# Patient Record
Sex: Female | Born: 1980 | Hispanic: Yes | Marital: Single | Smoking: Never smoker
Health system: Southern US, Community
[De-identification: ages and names within clinical notes are randomized; demographics above are authoritative.]

## PROBLEM LIST (undated history)

## (undated) ENCOUNTER — Inpatient Hospital Stay (HOSPITAL_COMMUNITY): Payer: Self-pay

## (undated) ENCOUNTER — Emergency Department (HOSPITAL_COMMUNITY): Payer: Self-pay

## (undated) ENCOUNTER — Emergency Department (HOSPITAL_COMMUNITY): Admission: EM | Payer: Self-pay

## (undated) DIAGNOSIS — F32A Depression, unspecified: Secondary | ICD-10-CM

## (undated) DIAGNOSIS — E119 Type 2 diabetes mellitus without complications: Secondary | ICD-10-CM

## (undated) DIAGNOSIS — Z789 Other specified health status: Secondary | ICD-10-CM

## (undated) HISTORY — DX: Type 2 diabetes mellitus without complications: E11.9

## (undated) HISTORY — PX: APPENDECTOMY: SHX54

## (undated) HISTORY — DX: Other specified health status: Z78.9

---

## 2012-03-30 ENCOUNTER — Other Ambulatory Visit (HOSPITAL_COMMUNITY): Payer: Self-pay | Admitting: Physician Assistant

## 2012-03-30 DIAGNOSIS — Z0489 Encounter for examination and observation for other specified reasons: Secondary | ICD-10-CM

## 2012-03-30 LAB — OB RESULTS CONSOLE HEPATITIS B SURFACE ANTIGEN: Hepatitis B Surface Ag: NEGATIVE

## 2012-03-30 LAB — OB RESULTS CONSOLE HIV ANTIBODY (ROUTINE TESTING): HIV: NONREACTIVE

## 2012-03-30 LAB — OB RESULTS CONSOLE GC/CHLAMYDIA
Chlamydia: NEGATIVE
Gonorrhea: NEGATIVE

## 2012-03-30 LAB — GLUCOSE TOLERANCE, 1 HOUR (50G) W/O FASTING: Glucose, GTT - 1 Hour: 108 mg/dL (ref ?–200)

## 2012-03-30 LAB — OB RESULTS CONSOLE RPR: RPR: NONREACTIVE

## 2012-03-30 LAB — OB RESULTS CONSOLE PLATELET COUNT: Platelets: 239 10*3/uL

## 2012-03-30 LAB — OB RESULTS CONSOLE ABO/RH: RH Type: POSITIVE

## 2012-03-31 ENCOUNTER — Ambulatory Visit (HOSPITAL_COMMUNITY)
Admission: RE | Admit: 2012-03-31 | Discharge: 2012-03-31 | Disposition: A | Discharge status: Home / Self Care | Payer: Self-pay | Source: Ambulatory Visit | Attending: Physician Assistant | Admitting: Physician Assistant

## 2012-03-31 DIAGNOSIS — Z1389 Encounter for screening for other disorder: Secondary | ICD-10-CM | POA: Insufficient documentation

## 2012-03-31 DIAGNOSIS — O09299 Supervision of pregnancy with other poor reproductive or obstetric history, unspecified trimester: Secondary | ICD-10-CM | POA: Insufficient documentation

## 2012-03-31 DIAGNOSIS — O358XX0 Maternal care for other (suspected) fetal abnormality and damage, not applicable or unspecified: Secondary | ICD-10-CM | POA: Insufficient documentation

## 2012-03-31 DIAGNOSIS — Z363 Encounter for antenatal screening for malformations: Secondary | ICD-10-CM | POA: Insufficient documentation

## 2012-03-31 DIAGNOSIS — O0930 Supervision of pregnancy with insufficient antenatal care, unspecified trimester: Secondary | ICD-10-CM | POA: Insufficient documentation

## 2012-03-31 DIAGNOSIS — Z0489 Encounter for examination and observation for other specified reasons: Secondary | ICD-10-CM

## 2012-04-08 ENCOUNTER — Ambulatory Visit (INDEPENDENT_AMBULATORY_CARE_PROVIDER_SITE_OTHER): Payer: Self-pay | Admitting: Advanced Practice Midwife

## 2012-04-08 ENCOUNTER — Encounter: Payer: Self-pay | Admitting: Advanced Practice Midwife

## 2012-04-08 VITALS — BP 113/72 | Temp 97.1°F | Wt 142.0 lb

## 2012-04-08 DIAGNOSIS — O0990 Supervision of high risk pregnancy, unspecified, unspecified trimester: Secondary | ICD-10-CM | POA: Insufficient documentation

## 2012-04-08 DIAGNOSIS — O09219 Supervision of pregnancy with history of pre-term labor, unspecified trimester: Secondary | ICD-10-CM

## 2012-04-08 LAB — POCT URINALYSIS DIP (DEVICE)
Bilirubin Urine: NEGATIVE
Glucose, UA: NEGATIVE mg/dL
Ketones, ur: NEGATIVE mg/dL
Leukocytes, UA: NEGATIVE
Nitrite: NEGATIVE

## 2012-04-08 NOTE — Progress Notes (Signed)
Pulse: 71

## 2012-04-08 NOTE — Progress Notes (Signed)
   Subjective:    Kathryn Glenn is a [id number] [redacted]w[redacted]d being seen today for her first obstetrical visit.  She is transferring care from [facility].  Her obstetrical history is significant for Two NSVDs followed by hx of preterm birth, C/S at 35 weeks in [place] for "baby was too small to be born normally". Pt cannot not read or write.  Patient does intend to breast feed. Pregnancy history fully reviewed.Hospital Spanish interpreter used for all communication.   Patient reports no complaints.  Filed Vitals:   04/08/12 1006  BP: 113/72  Temp: 97.1 F (36.2 C)  Weight: 142 lb (64.411 kg)    HISTORY: OB History    Grav Para Term Preterm Abortions TAB SAB Ect Mult Living   5 3 2 1 1 1    3      # Outc Date GA Lbr Len/2nd Wgt Sex Del Anes PTL Lv   1 TRM 2001 [redacted]w[redacted]d  8lb8oz(3.856kg) F SVD EPI No Yes   2 TRM 2002 [redacted]w[redacted]d  7lb(3.175kg) M SVD EPI No Yes   3 PRE 2003 [redacted]w[redacted]d   M LTCS EPI Yes Yes   4 TAB 2011           5 CUR              Past Medical History  Diagnosis Date  . No pertinent past medical history    Past Surgical History  Procedure Date  . Cesarean section   . Appendectomy    Family History  Problem Relation Age of Onset  . Heart disease Mother      Exam  BP 113/72  Temp 97.1 F (36.2 C)  Wt 142 lb (64.411 kg)  LMP 09/26/2011  Pt alert and oriented, without distress, during today's visit Physical Examination: General appearance - alert, well appearing, and in no distress Neck - supple, no significant adenopathy Chest - clear to auscultation, no wheezes, rales or rhonchi, symmetric air entry Heart - normal rate, regular rhythm, normal S1, S2, no murmurs, rubs, clicks or gallops Abdomen - soft, nontender, gravid, no masses or organomegaly Pelvic - deferred--completed at [facility] Musculoskeletal - no joint tenderness, deformity or swelling Extremities - peripheral pulses normal, no pedal edema, no clubbing or cyanosis   Assessment:    Pregnancy: [id number] 1. History  of preterm delivery, currently pregnant   2. Supervision of high-risk pregnancy         Plan:     Initial labs done at [facility] in [place] tab Prenatal vitamins. Problem list reviewed and updated. Genetic Screening discussed : declined.  Ultrasound discussed; fetal survey: results reviewed.  Follow up in 2 weeks. 50% of 30 min visit spent on counseling and coordination of care.     LEFTWICH-KIRBY, LISA 04/08/2012

## 2012-04-22 ENCOUNTER — Encounter: Payer: Self-pay | Admitting: Family Medicine

## 2012-04-22 ENCOUNTER — Ambulatory Visit (INDEPENDENT_AMBULATORY_CARE_PROVIDER_SITE_OTHER): Payer: Self-pay | Admitting: Family Medicine

## 2012-04-22 VITALS — BP 102/67 | Temp 98.0°F | Wt 142.9 lb

## 2012-04-22 DIAGNOSIS — O09219 Supervision of pregnancy with history of pre-term labor, unspecified trimester: Secondary | ICD-10-CM

## 2012-04-22 DIAGNOSIS — O34219 Maternal care for unspecified type scar from previous cesarean delivery: Secondary | ICD-10-CM

## 2012-04-22 DIAGNOSIS — Z98891 History of uterine scar from previous surgery: Secondary | ICD-10-CM | POA: Insufficient documentation

## 2012-04-22 DIAGNOSIS — Z23 Encounter for immunization: Secondary | ICD-10-CM

## 2012-04-22 DIAGNOSIS — O09899 Supervision of other high risk pregnancies, unspecified trimester: Secondary | ICD-10-CM

## 2012-04-22 LAB — POCT URINALYSIS DIP (DEVICE)
Hgb urine dipstick: NEGATIVE
Ketones, ur: NEGATIVE mg/dL
Protein, ur: NEGATIVE mg/dL
Specific Gravity, Urine: 1.02 (ref 1.005–1.030)
Urobilinogen, UA: 0.2 mg/dL (ref 0.0–1.0)

## 2012-04-22 MED ORDER — INFLUENZA VIRUS VACC SPLIT PF IM SUSP
0.5000 mL | Freq: Once | INTRAMUSCULAR | Status: AC
  Administered 2012-04-22: 0.5 mL via INTRAMUSCULAR

## 2012-04-22 NOTE — Patient Instructions (Signed)
Lactancia materna  (Breastfeeding) Decidir amamantar es una de las mejores elecciones que puede hacer por usted y su beb. La informacin que se brinda a continuacin le dar una breve visin de los beneficios de la lactancia materna as como de las dudas ms frecuentes alrededor de ella.  LOS BENEFICIOS DE AMAMANTAR  Para el beb   La primera leche (calostro ) ayuda al mejor funcionamiento del sistema digestivo del beb.   La leche tiene anticuerpos que provienen de la madre y que ayudan a prevenir las infecciones en el beb.   El beb tiene una menor incidencia de asma, alergias y del sndrome de muerte sbita del lactante (SMSL).   Los nutrientes en la leche materna son mejores para el beb que los preparados para lactantes y la leche materna ayuda a un mejor desarrollo del cerebro del beb.   Los bebs amamantados tienen menos gases, clicos y estreimiento.  Para la mam   La lactancia materna favorece el desarrollo de un vnculo muy especial entre la madre y el beb.   Es ms conveniente, siempre disponible y a la temperatura adecuada y econmico.   Consume caloras en la madre y la ayuda a perder el peso ganado durante el embarazo.   Favorece la contraccin del tero a su tamao normal, de manera ms rpida y disminuye las hemorragias luego del parto.   Las madres que amamantan tienen menor riesgo de desarrollar cncer de mama.  FRECUENCIA DEL AMAMANTAMIENTO   Un beb sano, nacido a trmino, puede amamantarse con tanta frecuencia como cada hora, o espaciar las comidas cada tres horas.   Observe al beb cuando manifieste signos de hambre. Amamante a su beb si muestra signos de hambre. Esta frecuencia variar de un beb a otro.   Amamntelo tan seguido como el beb lo solicite, o cuando usted sienta la necesidad de aliviar sus mamas.   Despierte al beb si han pasado 3  4 horas desde la ltima comida.   El amamantamiento frecuente la ayudar a producir ms  leche y a prevenir problemas de dolor en los pezones e hinchazn de las mamas.  POSICIN DEL BEBE PARA EL AMAMANTAMIENTO   Ya sea que se encuentre acostada o sentada, asegrese que el abdomen del beb enfrente el suyo.   Sostenga la mama con el pulgar por arriba y los otros 4 dedos por debajo. Asegrese que sus dedos se encuentren lejos del pezn y de la boca del beb.   Empuje suavemente los labios del beb con el pezn o con el dedo.   Cuando la boca del beb se abra lo suficiente, introduzca el pezn y la areola tanto como le sea posible dentro de la boca.   Coloque al beb cerca suyo de modo que su nariz y mejillas toquen las mamas al mamar.  ALIMENTACIN Y SUCCIN   La duracin de cada comida vara de un beb a otro y de una comida a otra.   El beb debe succionar entre 2 y 3 minutos para que le llegue leche. Esto se denomina "bajada". Por este motivo, permita que el nio se alimente en cada mama todo lo que desee. Terminar de mamar cuando haya recibido la cantidad adecuada de nutrientes.   Para detener la succin coloque su dedo en la comisura de la boca del nio y deslcelo entre sus encas antes de quitarle la mama de la boca. Esto la ayudar a evitar el dolor en los pezones.  COMO SABER SI EL BEB OBTIENE LA   SUFICIENTE LECHE MATERNA  Preguntarse si el beb obtiene la cantidad suficiente de leche es una preocupacin frecuente entre las madres. Puede asegurarse que el beb tiene la leche suficiente si:   El beb succiona activamente y usted escucha que traga .   El beb parece estar relajado y satisfecho despus de mamar.   El nio se alimenta al menos 8 a 12 veces en 24 horas. Alimntelo hasta que se desprenda por sus propios medios o se quede dormido en la primera mama (al menos durante 10 a 20 minutos), luego ofrzcale el otro lado.   El beb moja 5 a 6 paales desechables (6 a 8 paales de tela) en 24 horas cuando tiene 5  6 das de vida.   Tiene al menos 3 a 4  deposiciones todos los das en los primeros meses. La materia fecal debe ser blanda y amarillenta.   El beb debe aumentar 4 a 6 libras (120 a 170 gr.) por semana despus de los 4 das de vida.   Siente que las mamas se ablandan despus de amamantar  REDUCIR LA CONGESTIN DE LAS MAMAS   Durante la primera semana despus del parto, usted puede experimentar hinchazn en las mamas. Cuando las mamas estn congestionadas, se sienten calientes, llenas y molestas al tacto. Puede reducir la congestin si:   Lo amamanta frecuentemente, cada 2-3 horas. Las mams que amamantan pronto y con frecuencia tienen menos problemas de congestin.   Coloque compresas de hielo en sus mamas durante 10-20 minutos entre cada amamantamiento. Esto ayuda a reducir la hinchazn. Envuelva las bolsas de hielo en una toalla liviana para proteger su piel. Las bolsas de vegetales congelados funcionan bien para este propsito.   Tome una ducha tibia o aplique compresas hmedas calientes en las mamas durante 5 a 10 minutos antes de cada vez que amamanta. Esto aumenta la circulacin y ayuda a que la leche fluya.   Masajee suavemente la mama antes y durante la alimentacin. Con las puntas de los dedos, masajee desde la pared torcica hacia abajo hasta llegar al pezn, con movimientos circulares.   Asegrese que el nio vaca al menos una mama antes de cambiar de lado.   Use un sacaleche para vaciar la mama si el beb se duerme o no se alimenta bien. Tambin podr quitarse la leche con esa bomba si tiene que volver al trabajo o siente que las mamas estn congestionadas.   Evite los biberones, chupetes o complementar la alimentacin con agua o jugos en lugar de la leche materna. La leche materna es todo el alimento que el beb necesita. No es necesario que el nio ingiera agua o preparados de bibern. De hecho, es lo mejor para ayudar a que las mamas produzcan ms leche. no darle suplementos al nio durante las primeras  semanas.   Verifique que el beb se encuentra en la posicin correcta mientras lo alimenta.   Use un sostn que soporte bien sus mamas y evite los que tienen aro.   Consuma una dieta balanceada y beba lquidos en cantidad.   Descanse con frecuencia, reljese y tome sus vitaminas prenatales para evitar la fatiga, el estrs y la anemia.  Si sigue estas indicaciones, la congestin debe mejorar en 24 a 48 horas. Si an tiene dificultades, consulte a su asesor en lactancia.  CUDESE USTED MISMA  Cuide sus mamas.   Bese o dchese diariamente.   Evite usar jabn en los pezones.   Comience a amamantar del lado izquierdo en una comida   y del lado derecho en la siguiente.   Notar que aumenta el flujo de leche a los 2 a 5 das despus del parto. Puede sentir algunas molestias por la congestin, lo que hace que sus mamas estn duras y sensibles. La congestin disminuye en 24 a 48 horas. Mientras tanto, aplique toallas hmedas calientes durante 5 a 10 minutos antes de amamantar. Un masaje suave y la extraccin de un poco de leche antes de amamantar ablandarn las mamas y har ms fcil que el beb se agarre.   Use un buen sostn y seque al aire los pezones durante 3 a 4 minutos luego de cada alimentacin.   Solo utilice apsitos de algodn.   Utilice lanolina pura sobre los pezones luego de amamantar. No necesita lavarlos luego de alimentar al nio. Otra opcin es exprimir algunas gotas de leche y masajear suavemente los pezones.  Cumpla con estos cuidados   Consuma alimentos bien balanceados y refrigerios nutritivos.   Beba leche, jugos de fruta y agua para satisfacer la sed (alrededor de 8 vasos por da).   Descanse lo suficiente.  Evite los alimentos que usted note que pueden afectar al beb.  SOLICITE ATENCIN MDICA SI:   Tiene dificultad con la lactancia materna y necesita ayuda.   Tiene una zona de color rojo, dura y dolorosa en la mama que se acompaa de fiebre.    El beb est muy somnoliento como para alimentarse bien o tiene problemas para dormir.   Su beb moja menos de 6 paales al da, a los 5 das de vida.   La piel del beb o la parte blanca de sus ojos est ms amarilla de lo que estaba en el hospital.   Se siente deprimida.  Document Released: 06/09/2005 Document Revised: 12/09/2011 [employer] Patient Information 2013 [employer].  

## 2012-04-22 NOTE — Progress Notes (Signed)
Was told to wait 10 years after last pregnancy to get pregnant again. No records available.  Nml anatomy but dating is wrong-do not think she needs a f/u scan as all markers were on target.

## 2012-04-22 NOTE — Progress Notes (Signed)
Pulse- 87 

## 2012-05-06 ENCOUNTER — Encounter: Payer: Self-pay | Admitting: Obstetrics and Gynecology

## 2012-05-06 ENCOUNTER — Ambulatory Visit (INDEPENDENT_AMBULATORY_CARE_PROVIDER_SITE_OTHER): Payer: Self-pay | Admitting: Obstetrics and Gynecology

## 2012-05-06 VITALS — BP 105/67 | Temp 97.0°F | Wt 144.4 lb

## 2012-05-06 DIAGNOSIS — O0990 Supervision of high risk pregnancy, unspecified, unspecified trimester: Secondary | ICD-10-CM

## 2012-05-06 DIAGNOSIS — O09219 Supervision of pregnancy with history of pre-term labor, unspecified trimester: Secondary | ICD-10-CM

## 2012-05-06 DIAGNOSIS — O34219 Maternal care for unspecified type scar from previous cesarean delivery: Secondary | ICD-10-CM

## 2012-05-06 LAB — POCT URINALYSIS DIP (DEVICE)
Leukocytes, UA: NEGATIVE
Nitrite: NEGATIVE
Protein, ur: NEGATIVE mg/dL
Urobilinogen, UA: 0.2 mg/dL (ref 0.0–1.0)
pH: 7 (ref 5.0–8.0)

## 2012-05-06 NOTE — Progress Notes (Signed)
Pulse 88 Patient has no complaints today, states everything is going well

## 2012-05-06 NOTE — Progress Notes (Signed)
Patient doing well without complaints. PTL precautions reviewed. Patient planning on breast and formula feed. Patient will be using condoms for birth control.

## 2012-05-27 ENCOUNTER — Ambulatory Visit (INDEPENDENT_AMBULATORY_CARE_PROVIDER_SITE_OTHER): Payer: Self-pay | Admitting: Obstetrics & Gynecology

## 2012-05-27 VITALS — BP 105/68 | Temp 97.3°F | Wt 146.4 lb

## 2012-05-27 DIAGNOSIS — O09219 Supervision of pregnancy with history of pre-term labor, unspecified trimester: Secondary | ICD-10-CM

## 2012-05-27 DIAGNOSIS — O09899 Supervision of other high risk pregnancies, unspecified trimester: Secondary | ICD-10-CM

## 2012-05-27 DIAGNOSIS — O0990 Supervision of high risk pregnancy, unspecified, unspecified trimester: Secondary | ICD-10-CM

## 2012-05-27 LAB — POCT URINALYSIS DIP (DEVICE)
Protein, ur: NEGATIVE mg/dL
Urobilinogen, UA: 0.2 mg/dL (ref 0.0–1.0)

## 2012-05-27 NOTE — Progress Notes (Signed)
P = 85 Mild occasional pain in abdomen and back

## 2012-05-27 NOTE — Progress Notes (Signed)
No complaints

## 2012-05-27 NOTE — Patient Instructions (Signed)
Parto prematuro  (Preterm Labor) El parto prematuro comienza antes de la semana 37 de embarazo. La duracin de un embarazo normal es de 39 a 41 semanas.  CAUSAS  Generalmente no hay una causa que pueda identificarse. Sin embargo, una de las causas conocidas ms frecuentes son las infecciones. Las infecciones del tero, el cuello, la vagina, el lquido amnitico, la vejiga, los riones y hasta de los pulmones (neumona) pueden hacer que el trabajo de parto se inicie. Otras causas son:   Infecciones urogenitales, como infecciones por hongos y vaginosis bacteriana.  Anormalidades uterinas (forma del tero, sptum uterino, fibromas, hemorragias en la placenta).  Un cuello que ha sido operado y se abre prematuramente.  Malformaciones del beb.  Gestaciones mltiples (mellizos, trillizos y ms).  Ruptura del saco amnitico. :Otros factores de riesgo del parto prematuro son   Historia previa de parto prematuro.  Ruptura prematura de las membranas.  La placenta cubre la apertura del cuello (placenta previa).  La placenta se separa del tero (abrupcin placentaria).  El cuello es demasiado dbil para contener al beb en el tero (cuello incompetente).  Hay mucho lquido en el saco amnitico (polihidramnios).  Consumo de drogas o hbito de fumar durante el embarazo.  No aumentar de peso lo suficiente durante el embarazo.  Mujeres menores de 18 aos o mayores de 35 aos aos.  Nivel socioeconmico bajo.  Raza afroamericana. SNTOMAS  Los signos y sntomas son:   Clicos del tipo menstrual  Contracciones con un intervalo entre 30 y 70 segundos, comienzan a ser regulares, se hacen ms frecuentes y se hacen ms intensas y dolorosas.  Contracciones que comienzan en la parte superior del tero y se expanden hacia abajo, hacia la zona inferior del abdomen y la espalda.  Sensacin de presin en la pelvis o dolor en la espalda.  Aparece una secrecin acuosa o sanguinolenta por la  vagina. DIAGNSTICO  El diagnstico puede confirmarse:   Con un examen vaginal.  Ecografa del cuello.  Muestra (hisopado) de las secreciones crvico-vaginales. Estas muestras se analizan para buscar la presencia de fibronectina fetal. Esta protena que se encuentra en las secreciones del tero y se asocia con el parto prematuro.  Monitoreo fetal TRATAMIENTO  Segn el tiempo del embarazo y otras circunstancias, el mdico puede indicar reposo en cama. Si es necesario, le indicarn medicamentos para detener las contracciones y apurar la maduracin de los pulmones del feto. Si el trabajo de parto se inicia antes de las 34 semanas de embarazo, se recomienda la hospitalizacin. El tratamiento depende de las condiciones en que se encuentre la madre y el beb.  PREVENCIN  Hay algunas cosas que una madre puede hacer para disminuir el riesgo de trabajo de parto prematuro en futuros embarazos. Una mam puede:   Dejar de fumar.  Mantener un peso saludable y evitar sustancias qumicas y drogas innecesarias.  Controlar todo tipo de infeccin.  Informar al mdico si tiene una historia conocida de parto prematuro. Document Released: 09/16/2007 Document Revised: 09/01/2011 [employer] Patient Information 2013 [employer].  

## 2012-06-10 ENCOUNTER — Ambulatory Visit (INDEPENDENT_AMBULATORY_CARE_PROVIDER_SITE_OTHER): Payer: Self-pay | Admitting: Obstetrics & Gynecology

## 2012-06-10 ENCOUNTER — Encounter: Payer: Self-pay | Admitting: Obstetrics & Gynecology

## 2012-06-10 VITALS — BP 106/69 | Temp 97.3°F | Wt 146.7 lb

## 2012-06-10 DIAGNOSIS — O34219 Maternal care for unspecified type scar from previous cesarean delivery: Secondary | ICD-10-CM

## 2012-06-10 DIAGNOSIS — O09219 Supervision of pregnancy with history of pre-term labor, unspecified trimester: Secondary | ICD-10-CM

## 2012-06-10 LAB — CBC
MCH: 30.9 pg (ref 26.0–34.0)
MCHC: 34.1 g/dL (ref 30.0–36.0)
RDW: 14.2 % (ref 11.5–15.5)

## 2012-06-10 LAB — POCT URINALYSIS DIP (DEVICE)
Glucose, UA: NEGATIVE mg/dL
Hgb urine dipstick: NEGATIVE
Nitrite: NEGATIVE
Protein, ur: NEGATIVE mg/dL
Urobilinogen, UA: 0.2 mg/dL (ref 0.0–1.0)

## 2012-06-10 NOTE — Progress Notes (Signed)
Pulse 83 28week labs today. 1 hr gtt due at 1120, also needs cbc and rpr

## 2012-06-10 NOTE — Patient Instructions (Signed)
Embarazo  Tercer trimestre  (Pregnancy - Third Trimester) El tercer trimestre del embarazo (los ltimos 3 meses) es el perodo en el cual tanto usted como su beb crecen con ms rapidez. El beb alcanza un largo de aproximadamente 50 cm. y pesa entre 2,700 y 4,500 kg. El beb gana ms tejido graso y est listo para la vida fuera del cuerpo de la madre. Mientras estn en el interior, los bebs tienen perodos de sueo y vigilia, succionan el pulgar y tienen hipo. Quizs sienta pequeas contracciones del tero. Este es el falso trabajo de parto. Tambin se las conoce como contracciones de Braxton-Hicks . Es como una prctica del parto. Los problemas ms habituales de esta etapa del embarazo incluyen mayor dificultad para respirar, hinchazn de las manos y los pies por retencin de lquidos y la necesidad de orinar con ms frecuencia debido a que el tero y el beb presionan sobre la vejiga.  EXAMENES PRENATALES   Durante los exmenes prenatales, deber seguir realizndose anlisis de sangre. Estas pruebas se realizan para controlar su salud y la del beb. Los anlisis de sangre se realizan para conocer los niveles de algunos compuestos de la sangre (hemoglobina). La anemia (bajo nivel de hemoglobina) es frecuente durante el embarazo. Para prevenirla, se administran hierro y vitaminas. Tambin le tomarn nuevas anlisis para descartar diabetes. Podrn repetirle algunas de las pruebas que le hicieron previamente.  En cada visita le medirn el tamao del tero. Esto permite asegurar que el beb se desarrolla adecuadamente, segn la fecha del embarazo.  Le controlarn la presin arterial en cada visita prenatal. Esto es para asegurarse de que no sufre toxemia.  Le harn un anlisis de orina en cada visita prenatal, para descartar infecciones, diabetes y la presencia de protenas.  Tambin en cada visita controlarn su peso. Esto se realiza para asegurarse que aumenta de peso al ritmo indicado y que usted y su  beb evolucionan normalmente.  En algunas ocasiones se realiza una prueba de ultrasonido para confirmar el correcto desarrollo y evolucin del beb. Esta prueba se realiza con ondas sonoras inofensivas para el beb, de modo que el profesional pueda calcular ms precisamente la fecha del parto.  Analice con su mdico los analgsicos y la anestesia que recibir durante el trabajo de parto y el parto.  Comente la posibilidad de que necesite una cesrea y qu anestesia se recibir.  Informe a su mdico si sufre violencia familiar mental o fsica. A veces, se indica la prueba especializada sin estrs, la prueba de tolerancia a las contracciones y el perfil biofsico para asegurarse de que el beb no tiene problemas. El estudio del lquido amnitico que rodea al beb se llama amniocentesis. El lquido amnitico se obtiene introduciendo una aguja en el vientre (abdomen ). En ocasiones se lleva a cabo cerca del final del embarazo, si es necesario inducir a un parto. En este caso se realiza para asegurarse que los pulmones del beb estn lo suficientemente maduros como para que pueda vivir fuera del tero. Si los pulmones no han madurado y es peligroso que el beb nazca, se administrar a la madre una inyeccin de cortisona , 1 a 2 das antes del parto. . Esto ayuda a que los pulmones del beb maduren y sea ms seguro su nacimiento.  CAMBIOS QUE OCURREN EN EL TERCER TRIMESTRE DEL EMBARAZO  Su organismo atravesar numerosos cambios durante el embarazo. Estos pueden variar de una persona a otra. Converse con el profesional que la asiste acerca los cambios que   usted note y que la preocupen.   Durante el ltimo trimestre probablemente sienta un aumento del apetito. Es normal tener "antojos" de ciertas comidas. Esto vara de una persona a otra y de un embarazo a otro.  Podrn aparecer las primeras estras en las caderas, abdomen y mamas. Estos son cambios normales del cuerpo durante el embarazo. No existen  medicamentos ni ejercicios que puedan prevenir estos cambios.  La constipacin puede tratarse con un laxante o agregando fibra a su dieta. Beber grandes cantidades de lquidos, tomar fibras en forma de vegetales, frutas y granos integrales es de gran ayuda.  Tambin es beneficioso practicar actividad fsica. Si ha sido una persona activa hasta el embarazo, podr continuar con la mayora de las actividades durante el mismo. Si ha sido menos activa, puede ser beneficioso que comience con un programa de ejercicios, como realizar caminatas. Consulte con el profesional que la asiste antes de comenzar un programa de ejercicios.  Evite el consumo de cigarrillos, el alcohol, los medicamentos no recetados y las "drogas de la calle" durante el embarazo. Estas sustancias qumicas afectan la formacin y el desarrollo del beb. Evite estas sustancias durante todo el embarazo para asegurar el nacimiento de un beb sano.  Podr sentir dolor de espalda, tener vrices en las venas y hemorroides, o si ya los sufra, pueden empeorar.  Durante el tercer trimestre se cansar con ms facilidad, lo cual es normal.  Los movimientos del beb pueden ser ms fuertes y con ms frecuencia.  Puede que note dificultades para respirar normalmente.  El ombligo puede salir hacia afuera.  A veces sale una secrecin amarilla de las mamas, que se llama calostro.  Podr aparecer una secrecin mucosa con sangre. Esto suele ocurrir entre unos pocos das y una semana antes del parto. INSTRUCCIONES PARA EL CUIDADO EN EL HOGAR   Cumpla con las citas de control. Siga las indicaciones del mdico con respecto al uso de medicamentos, los ejercicios y la dieta.  Durante el embarazo debe obtener nutrientes para usted y para su beb. Consuma alimentos balanceados a intervalos regulares. Elija alimentos como carne, pescado, leche y otros productos lcteos descremados, vegetales, frutas, panes integrales y cereales. El mdico le informar  cul es el aumento de peso ideal.  Las relaciones sexuales pueden continuarse hasta casi el final del embarazo, si no se presentan otros problemas como prdida prematura (antes de tiempo) de lquido amnitico, hemorragia vaginal o dolor en el vientre (abdominal).  Realice actividad fsica todos los das, si no tiene restricciones. Consulte con el profesional que la asiste si no sabe con certeza si determinados ejercicios son seguros. El mayor aumento de peso se producir en los ltimos 2 trimestres del embarazo. El ejercicio ayuda a:  Controlar su peso.  Mantenerse en forma para el trabajo de parto y el parto .  Perder peso despus del parto.  Haga reposo con frecuencia, con las piernas elevadas, o segn lo necesite para evitar los calambres y el dolor de cintura.  Use un buen sostn o como los que se usan para hacer deportes para aliviar la sensibilidad de las mamas. Tambin puede serle til si lo usa mientras duerme. Si pierde calostro, podr utilizar apsitos en el sostn.  No utilice la baera con agua caliente, baos turcos y saunas.  Colquese el cinturn de seguridad cuando conduzca. Este la proteger a usted y al beb en caso de accidente.  Evite comer carne cruda y el contacto con los utensilios y desperdicios de los gatos. Estos elementos   contienen grmenes que pueden causar defectos de nacimiento en el beb.  Es fcil perder algo de orina durante el embarazo. Apretar y fortalecer los msculos de la pelvis la ayudar con este problema. Practique detener la miccin cuando est en el bao. Estos son los mismos msculos que necesita fortalecer. Son tambin los mismos msculos que utiliza cuando trata de evitar despedir gases. Puede practicar apretando estos msculos diez veces, y repetir esto tres veces por da aproximadamente. Una vez que conozca qu msculos debe apretar, no realice estos ejercicios durante la miccin. Puede favorecerle una infeccin si la orina vuelve hacia  atrs.  Pida ayuda si tienen necesidades financieras, teraputicas o nutricionales. El profesional podr ayudarla con respecto a estas necesidades, o derivarla a otros especialistas.  Haga una lista de nmeros telefnicos de emergencia y tngalos disponibles.  Planifique como obtener ayuda de familiares o amigos cuando regrese a casa desde el hospital.  Hacer un ensayo sobre la partida al hospital.  Tome clases prenatales con el padre para entender, practicar y hacer preguntas sobre el trabajo de parto y el alumbramiento.  Preparar la habitacin del beb / busque una guardera.  No viaje fuera de la ciudad a menos que sea absolutamente necesario y con el asesoramiento de su mdico.  Use slo zapatos de tacn bajo o sin tacn para tener mejor equilibrio y evitar cadas. USO DE MEDICAMENTOS Y CONSUMO DE DROGAS DURANTE EL EMBARAZO   Tome las vitaminas apropiadas para esta etapa tal como se le indic. Las vitaminas deben contener un miligramo de cido flico. Guarde todas las vitaminas fuera del alcance de los nios. La ingestin de slo un par de vitaminas o tabletas que contengan hierro pueden ocasionar la muerte en un beb o en un nio pequeo.  Evite el uso de todos los medicamentos, incluyendo hierbas, medicamentos de venta libre, sin receta o que no hayan sido sugeridos por su mdico. Slo tome medicamentos de venta libre o medicamentos recetados para el dolor, el malestar o fiebre como lo indique su mdico. No tome aspirina, ibuprofeno (Motrin, Advil, Nuprin) o naproxeno (Aleve) excepto que su mdico se lo indique.  Infrmele al profesional si consume alguna droga.  El alcohol se relaciona con ciertos defectos congnitos. Incluye el sndrome de alcoholismo fetal. Debe evitar absolutamente el consumo de alcohol, en cualquier forma. El fumar produce baja tasa de natalidad y bebs prematuros.  Las drogas ilegales o de la calle son muy perjudiciales para el beb. Estn absolutamente  prohibidas. Un beb que nace de una madre adicta, ser adicto al nacer. Ese beb tendr los mismos sntomas de abstinencia que un adulto. SOLICITE ATENCIN MDICA SI:  Tiene preguntas o preocupaciones relacionadas con el embarazo. Es mejor que llame para formular las preguntas si no puede esperar hasta la prxima visita, que sentirse preocupada por ellas.  DECISIONES ACERCA DE LA CIRCUNCISIN  Usted puede saber o no cul es el sexo de su beb. Si ya sabe que ser un varn, este es el momento de pensar acerca de la circuncisin. La circuncisin es la extirpacin del prepucio. Esta es la piel que cubre el extremo sensible del pene. No hay un motivo mdico que lo justifique. Generalmente la decisin se toma segn lo que sea popular en ese momento, o segn creencias religiosas. Podr conversar estos temas con su mdico o con el pediatra.  SOLICITE ATENCIN MDICA DE INMEDIATO SI:   La temperatura oral le sube a ms de 102 F (38.9 C) o lo que su mdico le   indique.  Tiene una prdida de lquido por la vagina (canal de parto). Si sospecha una ruptura de las membranas, tmese la temperatura y llame al profesional para informarlo sobre esto.  Observa unas pequeas manchas, una hemorragia vaginal o elimina cogulos. Notifique al profesional acerca de la cantidad y de cuntos apsitos est utilizando.  Presenta un olor desagradable en la secrecin vaginal y observa un cambio en el color, de transparente a blanco.  Ha vomitado durante ms de 24 horas.  Siente escalofros o le sube la fiebre.  Le falta el aire.  Siente ardor al orinar.  Baja o sube ms de 2 libras (900 g), o segn lo indicado por el profesional que la asiste.  Observa que sbitamente se le hinchan el rostro, las manos, los pies o las piernas.  Siente dolor en el vientre (abdominal). Las molestias en el ligamento redondo son una causa benigna frecuente de dolor abdominal durante el embarazo. El profesional que la asiste deber  evaluarla.  Presenta dolor de cabeza intenso que no se alivia.  Tiene problemas visuales, visin doble o borrosa.  Si no siente los movimientos del beb durante ms de 1 hora. Si piensa que el beb no se mueve tanto como lo haca habitualmente, coma algo que contenga azcar y recustese sobre el lado izquierdo durante una hora. El beb debe moverse al menos 4  5 veces por hora. Comunquese inmediatamente si el beb se mueve menos que lo indicado.  Se cae, se ve involucrada en un accidente automovilstico o sufre algn tipo de traumatismo.  En su hogar hay violencia mental o fsica. Document Released: 03/19/2005 Document Revised: 12/09/2011 [employer] Patient Information 2013 [employer].  

## 2012-06-10 NOTE — Progress Notes (Signed)
Pt denies contractions, LOF or VB.  She is seeking other housing issues.  Has custody of her other children.  Will meet with the social worker today. F/u 2 weeks or sooner prn

## 2012-06-11 LAB — RPR

## 2012-06-11 LAB — GLUCOSE TOLERANCE, 1 HOUR (50G) W/O FASTING: Glucose, 1 Hour GTT: 121 mg/dL (ref 70–140)

## 2012-06-24 ENCOUNTER — Ambulatory Visit (INDEPENDENT_AMBULATORY_CARE_PROVIDER_SITE_OTHER): Payer: Self-pay | Admitting: Obstetrics & Gynecology

## 2012-06-24 VITALS — BP 86/59 | Temp 96.9°F | Ht <= 58 in | Wt 148.4 lb

## 2012-06-24 DIAGNOSIS — O0990 Supervision of high risk pregnancy, unspecified, unspecified trimester: Secondary | ICD-10-CM

## 2012-06-24 DIAGNOSIS — O09219 Supervision of pregnancy with history of pre-term labor, unspecified trimester: Secondary | ICD-10-CM

## 2012-06-24 DIAGNOSIS — O34219 Maternal care for unspecified type scar from previous cesarean delivery: Secondary | ICD-10-CM

## 2012-06-24 LAB — POCT URINALYSIS DIP (DEVICE)
Bilirubin Urine: NEGATIVE
Glucose, UA: NEGATIVE mg/dL
Leukocytes, UA: NEGATIVE
Nitrite: NEGATIVE
Urobilinogen, UA: 0.2 mg/dL (ref 0.0–1.0)
pH: 6 (ref 5.0–8.0)

## 2012-06-24 NOTE — Patient Instructions (Signed)
Return to clinic for any obstetric concerns or go to [facility]U for evaluation  

## 2012-06-24 NOTE — Progress Notes (Signed)
p=70 

## 2012-06-24 NOTE — Progress Notes (Signed)
Patient is Spanish-speaking only, Spanish interpreter present for this encounter.  Discussed details of previous cesarean section with patient, this is concerning for classical cesarean section. She underwent this in [place] at 28 weeks, weight was 1 lb, came in with pain and a lot of bleeding, baby was transverse and she underwent high risk cesarean section.  She was advised not to get pregnant for 10 years after this high risk cesarean section.  This scenario is very consistent with classical cesarean section.  Recommended delivery at 36-37 weeks because of high risk of uterine rupture; will send in surgical order.  No other complaints or concerns.  Fetal movement and labor precautions reviewed.

## 2012-07-07 ENCOUNTER — Encounter: Payer: Self-pay | Admitting: Advanced Practice Midwife

## 2012-07-08 ENCOUNTER — Encounter (HOSPITAL_COMMUNITY): Payer: Self-pay | Admitting: *Deleted

## 2012-07-08 ENCOUNTER — Inpatient Hospital Stay (HOSPITAL_COMMUNITY)
Admission: AD | Admit: 2012-07-08 | Discharge: 2012-07-08 | Disposition: A | Discharge status: Home / Self Care | Payer: Self-pay | Source: Ambulatory Visit | Attending: Obstetrics and Gynecology | Admitting: Obstetrics and Gynecology

## 2012-07-08 DIAGNOSIS — W010XXA Fall on same level from slipping, tripping and stumbling without subsequent striking against object, initial encounter: Secondary | ICD-10-CM | POA: Insufficient documentation

## 2012-07-08 DIAGNOSIS — O99891 Other specified diseases and conditions complicating pregnancy: Secondary | ICD-10-CM | POA: Insufficient documentation

## 2012-07-08 DIAGNOSIS — S0990XA Unspecified injury of head, initial encounter: Secondary | ICD-10-CM | POA: Insufficient documentation

## 2012-07-08 DIAGNOSIS — W19XXXA Unspecified fall, initial encounter: Secondary | ICD-10-CM

## 2012-07-08 DIAGNOSIS — R109 Unspecified abdominal pain: Secondary | ICD-10-CM | POA: Insufficient documentation

## 2012-07-08 DIAGNOSIS — M549 Dorsalgia, unspecified: Secondary | ICD-10-CM | POA: Insufficient documentation

## 2012-07-08 LAB — URINALYSIS, ROUTINE W REFLEX MICROSCOPIC
Bilirubin Urine: NEGATIVE
Hgb urine dipstick: NEGATIVE
Specific Gravity, Urine: 1.01 (ref 1.005–1.030)
Urobilinogen, UA: 0.2 mg/dL (ref 0.0–1.0)
pH: 6.5 (ref 5.0–8.0)

## 2012-07-08 MED ORDER — ACETAMINOPHEN 500 MG PO TABS: 1000.0000 mg | ORAL_TABLET | Freq: Four times a day (QID) | ORAL | Status: DC | PRN

## 2012-07-08 MED ORDER — OXYCODONE-ACETAMINOPHEN 5-325 MG PO TABS
1.0000 | ORAL_TABLET | Freq: Once | ORAL | Status: AC
  Administered 2012-07-08: 1 via ORAL
  Filled 2012-07-08: qty 1

## 2012-07-08 NOTE — [facility]U Note (Signed)
Pt states she fell yesterday, landed on her back, has been having lower abd pain ever since, worse on the right.  Denies any bleeding or LOF.  Feels active FM.

## 2012-07-08 NOTE — [facility]U Provider Note (Signed)
History     CSN: [id number]  Arrival date and time: 07/08/12 8469   First Provider Initiated Contact with Patient 07/08/12 1004      Chief Complaint  Patient presents with  . Fall  . Abdominal Pain   HPI 32 y.o. [id number] @ [redacted]w[redacted]d presents today for abd and back pain after tripping and falling yesterday hitting her back and head on the carpeted floor.  Pt reports pain in lower rt back and left mid and lower abd is a dull pain.  Fetal movement is present and unchanged.  Contractions have increased in intensity and frequency.  Pt also endorses a headache, bilateral eye pain, and blurred vision.  Denies any LOC or loss of sensation, dizziness, change in balance, vaginal bleeding, vaginal discharge, or cramping. OB History    Grav Para Term Preterm Abortions TAB SAB Ect Mult Living   5 3 2 1 1 1    3       Past Medical History  Diagnosis Date  . No pertinent past medical history   . Preterm labor     Past Surgical History  Procedure Date  . Appendectomy   . Cesarean section     C/S x 1    Family History  Problem Relation Age of Onset  . Heart disease Mother     History  Substance Use Topics  . Smoking status: Never Smoker   . Smokeless tobacco: Never Used  . Alcohol Use: No    Allergies: No Known Allergies  Prescriptions prior to admission  Medication Sig Dispense Refill  . Prenatal Vit-Fe Fumarate-FA (PRENATAL VITAMINS) 28-0.8 MG TABS Take 1 tablet by mouth.        Review of Systems  Constitutional: Negative for fever and chills.  HENT: Negative for hearing loss, ear pain, nosebleeds, tinnitus and ear discharge.   Eyes: Positive for blurred vision and pain (bilateral). Negative for double vision and photophobia.  Respiratory: Negative.   Cardiovascular: Negative.   Gastrointestinal: Positive for abdominal pain.  Genitourinary: Negative for dysuria.  Musculoskeletal: Positive for back pain and falls.  Skin: Negative for rash.  Neurological: Positive for  headaches. Negative for dizziness, tingling, sensory change, speech change, focal weakness, loss of consciousness and weakness.  Psychiatric/Behavioral: Negative for memory loss.   Physical Exam   Blood pressure 98/65, pulse 98, temperature 97.7 F (36.5 C), temperature source Oral, resp. rate 18, last menstrual period 09/26/2011.  Physical Exam  Constitutional: She is oriented to person, place, and time. She appears well-developed and well-nourished.  HENT:  Head: Normocephalic and atraumatic.  Nose: Nose normal.  Mouth/Throat: Oropharynx is clear and moist.  Eyes: Conjunctivae normal and EOM are normal. Pupils are equal, round, and reactive to light.  Neck: Normal range of motion. Neck supple.  Cardiovascular: Normal rate, regular rhythm, normal heart sounds and intact distal pulses.   Respiratory: Effort normal and breath sounds normal.  GI: Soft. Bowel sounds are normal. There is tenderness.  Genitourinary: There is no rash, tenderness, lesion or injury on the right labia. There is no rash, tenderness, lesion or injury on the left labia. No bleeding around the vagina. No vaginal discharge found.  Musculoskeletal: Normal range of motion.  Neurological: She is alert and oriented to person, place, and time. She has normal reflexes.  Skin: Skin is warm and dry. No rash noted. No erythema. No pallor.  Psychiatric: She has a normal mood and affect.  Dilation: Closed Effacement (%): Thick Cervical Position: Posterior Station: -3 Exam by::  Ivonne Andrew CNM FHR 140-150, minimal-moderate variability, 15x15 accels, no decels.  Toco: Intermittent periods of frequent UI and mild painless UC's. Improved w/ PO fluids   MAU Course  Procedures Dr. Patria Mane at [facility] consulted via phone at [phone] regarding pt's head injury.  No CT recommended at this time.  Precautionary instructions received and given to pt. Spontaneous resolution of pain when CNM returned to room.    Assessment and Plan   1. Fall     2. Abdominal pain in pregnancy   3. Head injury, closed    P: Follow up in clinic as scheduled - preterm labor precautions, fetal kick count -head injury precautions.  Henn[place]gaard, Bradley 07/08/2012, 10:49 AM

## 2012-07-14 NOTE — MAU Provider Note (Signed)
Attestation of Attending Supervision of Advanced Practitioner (CNM/NP): Evaluation and management procedures were performed by the Advanced Practitioner under my supervision and collaboration.  I have reviewed the Advanced Practitioner's note and chart, and I agree with the management and plan.  Terree Gaultney 07/14/2012 9:15 AM

## 2012-07-15 ENCOUNTER — Ambulatory Visit (INDEPENDENT_AMBULATORY_CARE_PROVIDER_SITE_OTHER): Payer: Self-pay | Admitting: Advanced Practice Midwife

## 2012-07-15 VITALS — BP 127/77 | Temp 97.0°F | Wt 149.2 lb

## 2012-07-15 DIAGNOSIS — O09899 Supervision of other high risk pregnancies, unspecified trimester: Secondary | ICD-10-CM

## 2012-07-15 LAB — POCT URINALYSIS DIP (DEVICE)
Bilirubin Urine: NEGATIVE
Glucose, UA: NEGATIVE mg/dL
Hgb urine dipstick: NEGATIVE
Ketones, ur: NEGATIVE mg/dL
Leukocytes, UA: NEGATIVE
Nitrite: NEGATIVE
pH: 7 (ref 5.0–8.0)

## 2012-07-15 NOTE — Progress Notes (Signed)
Pulse: 76

## 2012-07-15 NOTE — Patient Instructions (Signed)
Parto por cesrea  (Cesarean Delivery) El parto por cesrea es el nacimiento de un feto a travs de un corte (incisin) en el vientre (abdomen) y en el matriz (tero).  HGALE SABER A SU MDICO SOBRE:   Complicaciones del embarazo.  Alergias.  Medicamentos que [place], incluyendo hierbas, gotas oftlmicas, medicamentos de [place] y Control and instrumentation engineer.  Uso de corticoides (por va oral o cremas).  Problemas anteriores debido a anestsicos o a medicamentos que [employer] sensibilidad.  Cirugas anteriores.  Historia de cogulos sanguneos  Problemas hemorrgicos o en la sangre.  Otros problemas de [place]. RIESGOS Y COMPLICACIONES  Hemorragias.  Infeccin.  Cogulos sanguneos.  Lesin en los rganos circundantes.  Problemas con la anestesia.  Lesiones al beb. ANTES DEL PROCEDIMIENTO   Le insertarn un tubo (catter [employer] vejiga. El catter Foley drena la orina desde la vejiga hacia [place]. Esto mantendr la vejiga vaca durante la [place].  Le colocarn un catter de acceso intravenoso (IV) en el brazo.  Luego rasurarn la zona del pubis y la parte inferior del abdomen. Esto se realiza para evitar una infeccin en el sitio de la incisin.  Le administrarn un medicamento anticido. Esto impedir que los contenidos cidos del estmago ingresen a los pulmones si vomita durante el procedimiento.  Le podrn dar antibiticos para prevenir infecciones. PROCEDIMIENTO   Le administrarn un medicamento para adormecer a zona inferior del cuerpo anestesia regional). Si estuviera en trabajo de parto, podrn aplicarle una anestesia epidural, que se utiliza tanto el el [place] de parto como en la cesrea. Puede ser [employer] administren un medicamento que la har dormir (anestesia general), aunque esto no es tan frecuente.  Le harn una incisin en el abdomen que se extiende [employer]. Hay dos tipos bsicos de incisin:  La incisin horizontal (transversa) Las incisiones  horizontales se Engineer, maintenance mayor parte de las cesreas de [place].  La incisin vertical (de Jolene Schimke). Esta es la menos frecuente. Se reserva para aquellas mujeres que tienen una complicacin grave (parto prematuro extremo) o bajo situaciones de Associate Professor.  Las incisiones horizontales y Garment/textile technologist pueden utilizarse ambas al [employer]. Sin embargo, no es Air traffic controller.  El beb nacer. DESPUS DEL PROCEDIMIENTO   Si estuvo despierta durante la [place], podr ver al beb enseguida. Si la duermen, ver al beb tan pronto como despierte.  Podr amamantar a su beb despus del procedimiento.  Podr levantarse y Therapist, art mismo da de la [place]. Si Office manager en cama durante cierto tiempo, recibir ayuda para darse vuelta, toser y Industrial/product designer profundamente despus de la ciruga. Esto ayuda a Environmental health practitioner en los pulmones, como la neumona.  No se levante de la cama sola la primera vez luego de la [place]. Necesitar ayuda para levantarse de la cama hasta que pueda hacerlo sola.  Podr darse Shaune Spittle el da siguiente a la [place]. Despus que le quiten el vendaje, un enfermero la ayudar a ducharse.  Tendr unas medias compresivas neumticas en los pies o en la zona inferior de las piernas. Se colocan para prevenir cogulos sanguneos. Cuando se levante y camine regularmente, ya no sern necesarias.  Nocruce las piernas al sentarse.  Si elimina cogulos de sangre, gurdelos. Si elimina un cogulo cuando va al bao, por favor no tire la cadena. Llame al enfermero. Comunquele al enfermero si piensa que tiene demasiada hemorragia o que elimina muchos cogulos.  Comience a beber lquidos y a alimentarse segn las indicaciones del mdico. Si el  estmago no est en condiciones,comer y beber demasiado pronto puede causar aumento de la hinchazn y la inflamacin del intestino o el abdomen. Esto es [employer].  Le darn medicamentos si los necesita. Dgale a los  profesionales si siente dolor. Ellos tratarn de que se sienta cmoda. Tambin le indicarn antibiticos para prevenir una infeccin.  Le quitarn la va intravenosa cuando beba una cantidad razonable de lquido. El catter Foley se retirar cuando se levante y camine.  Si su tipo sanguneo es Rh negativo y el beb es Rh positivo, le darn una inyeccin de inmunoblobulina anti D. Esta inyeccin evita que tenga problemas con el Rh en embarazos futuros. Deber colocarse la inyeccin an si se ha Production assistant, radio las trompas.  Si le permiten llevar al beb a dar un paseo,colquelo en la cunita y empjela. No lleve al beb en sus brazos. Document Released: 06/09/2005 Document Revised: 09/01/2011 [facility] [address] Patient Information 2013 [place], [place].   Mtodo para contar los movimientos fetales (Fetal Movement Counts) Nombre de la paciente: __________________________________________________ Franco Nones probable de parto:____________________ En los embarazos de alto riesgo se recomienda contar las pataditas, pero tambin es una buena idea que lo hagan todas las Olivia. Comience a contarlas a las 28 semanas de embarazo. Los movimientos fetales aumentan luego de una comida Immunologist o de comer o beber algo dulce (el nivel de azcar en la sangre est ms alto). Tambin es importante beber gran cantidad de lquidos (hidratarse bien) antes de contar. Si se recuesta sobre el lado izquierdo mejorar la Designer, industrial/product, o puede sentarse en una silla cmoda con los brazos sobre el abdomen y sin distracciones que la rodeen. CONTANDO  Trate de contar a la [employer] lo haga.  Marque el da y la hora y vea cunto le lleva sentir 10 movimientos (patadas, agitaciones, sacudones, vueltas). Debe sentir al menos 10 movimientos en 2 horas. Probablemente sienta los 10 movimientos en menos de dos horas. Si no los siente, espere una hora y cuente nuevamente. Luego de [employer] tendr un patrn.  Debemos observar si  hay cambios en el patrn o no hay suficientes pataditas en 2 horas. Le lleva ms tiempo contar los 10 movimientos? SOLICITE ATENCIN MDICA SI:  Siente menos de 10 pataditas en 2 horas. Intntelo dos veces.  No siente movimientos durante 1 hora.  El patrn se modifica o le lleva ms tiempo Art gallery manager las 10 pataditas.  Siente que el beb no se mueve como lo hace habitualmente. Fecha: ____________ Movimientos: ____________ Comienzo hora: ____________ Cephas Darby: ____________ Franco Nones: ____________ Movimientos: ____________ Comienzo hora: ____________ Cephas Darby: ____________ Franco Nones: ____________ Movimientos: ____________ Comienzo hora: ____________ Cephas Darby: ____________ Franco Nones: ____________ Movimientos: ____________ Comienzo hora: ____________ Cephas Darby: ____________ Franco Nones: ____________ Movimientos: ____________ Comienzo hora: ____________ Cephas Darby: ____________ Franco Nones: ____________ Movimientos: ____________ Comienzo hora: ____________ Cephas Darby: ____________ Franco Nones: ____________ Movimientos: ____________ Comienzo hora: ____________ Cephas Darby: ____________  Franco Nones: ____________ Movimientos: ____________ Comienzo hora: ____________ Cephas Darby: ____________ Franco Nones: ____________ Movimientos: ____________ Comienzo hora: ____________ Cephas Darby: ____________ Franco Nones: ____________ Movimientos: ____________ Comienzo hora: ____________ Cephas Darby: ____________ Franco Nones: ____________ Movimientos: ____________ Comienzo hora: ____________ Cephas Darby: ____________ Franco Nones: ____________ Movimientos: ____________ Comienzo hora: ____________ Cephas Darby: ____________ Franco Nones: ____________ Movimientos: ____________ Comienzo hora: ____________ Cephas Darby: ____________ Franco Nones: ____________ Movimientos: ____________ Comienzo hora: ____________ Cephas Darby: ____________  Franco Nones: ____________ Movimientos: ____________ Comienzo hora: ____________ Cephas Darby: ____________ Franco Nones: ____________ Movimientos: ____________ Comienzo hora: ____________ Cephas Darby: ____________ Franco Nones: ____________ Movimientos: ____________ Comienzo hora: ____________ Cephas Darby:  ____________ Franco Nones: ____________ Movimientos: ____________ Comienzo hora: ____________ Cephas Darby: ____________ Franco Nones: ____________ Movimientos: ____________ Comienzo hora: ____________ Cephas Darby: ____________ Franco Nones: ____________ Movimientos: ____________ Comienzo hora: ____________ Cephas Darby: ____________ Franco Nones: ____________ Movimientos: ____________ Comienzo hora: ____________ Cephas Darby: ____________  Franco Nones: ____________ Movimientos: ____________ Comienzo hora: ____________ Cephas Darby: ____________ Franco Nones: ____________ Movimientos: ____________ Comienzo hora: ____________ Cephas Darby: ____________ Franco Nones: ____________ Movimientos: ____________ Comienzo hora: ____________ Cephas Darby: ____________ Franco Nones: ____________ Movimientos: ____________ Comienzo hora: ____________ Cephas Darby: ____________ Franco Nones: ____________ Movimientos: ____________ Comienzo hora: ____________ Cephas Darby: ____________ Franco Nones: ____________ Movimientos: ____________ Comienzo hora: ____________ Cephas Darby: ____________ Franco Nones: ____________ Movimientos: ____________ Comienzo hora: ____________ Cephas Darby: ____________  Franco Nones: ____________ Movimientos: ____________ Comienzo hora: ____________ Cephas Darby: ____________ Franco Nones: ____________ Movimientos: ____________ Comienzo hora: ____________ Cephas Darby: ____________ Franco Nones: ____________ Movimientos: ____________ Comienzo hora: ____________ Cephas Darby: ____________ Franco Nones: ____________ Movimientos: ____________ Comienzo hora: ____________ Cephas Darby: ____________ Franco Nones: ____________ Movimientos: ____________ Comienzo hora: ____________ Cephas Darby: ____________ Franco Nones: ____________ Movimientos: ____________ Comienzo hora: ____________ Cephas Darby: ____________ Franco Nones: ____________ Movimientos: ____________ Comienzo hora: ____________ Cephas Darby: ____________  Franco Nones: ____________ Movimientos: ____________ Comienzo hora:  ____________ Cephas Darby: ____________ Franco Nones: ____________ Movimientos: ____________ Comienzo hora: ____________ Cephas Darby: ____________ Franco Nones: ____________ Movimientos: ____________ Comienzo hora: ____________ Cephas Darby: ____________ Franco Nones: ____________ Movimientos: ____________ Comienzo hora: ____________ Cephas Darby: ____________ Franco Nones: ____________ Movimientos: ____________ Comienzo hora: ____________ Cephas Darby: ____________ Franco Nones: ____________ Movimientos: ____________ Comienzo hora: ____________ Cephas Darby: ____________ Franco Nones: ____________ Movimientos: ____________ Comienzo hora: ____________ Cephas Darby: ____________  Franco Nones: ____________ Movimientos: ____________ Comienzo hora: ____________ Cephas Darby: ____________ Franco Nones: ____________ Movimientos: ____________ Comienzo hora: ____________ Cephas Darby: ____________ Franco Nones: ____________ Movimientos: ____________ Comienzo hora: ____________ Cephas Darby: ____________ Franco Nones: ____________ Movimientos: ____________ Comienzo hora: ____________ Cephas Darby: ____________ Franco Nones: ____________ Movimientos: ____________ Comienzo hora: ____________ Cephas Darby: ____________ Franco Nones: ____________ Movimientos: ____________ Comienzo hora: ____________ Cephas Darby: ____________ Franco Nones: ____________ Movimientos: ____________ Comienzo hora: ____________ Cephas Darby: ____________  Franco Nones: ____________ Movimientos: ____________ Comienzo hora: ____________ Cephas Darby: ____________ Franco Nones: ____________ Movimientos: ____________ Comienzo hora: ____________ Cephas Darby: ____________ Franco Nones: ____________ Movimientos: ____________ Comienzo hora: ____________ Cephas Darby: ____________ Franco Nones: ____________ Movimientos: ____________ Comienzo hora: ____________ Cephas Darby: ____________ Franco Nones: ____________ Movimientos: ____________ Comienzo hora: ____________ Cephas Darby: ____________ Franco Nones: ____________ Movimientos: ____________ Comienzo hora: ____________ Cephas Darby: ____________ Franco Nones: ____________ Movimientos: ____________  Comienzo hora: ____________ Fin hora: ____________  Document Released: 09/16/2007 Document Revised: 09/01/2011 [employer] Patient Information 2013 [place], [place].   Contracciones de Designer, multimedia ([employer]xton [employer]) Usted presenta un falso trabajo de [place]. Durante todo el embarazo aparecen con frecuencia contracciones del tero. Hacia el final del embarazo (32-34 semanas) estas contracciones (Braxton Hicks) pueden hacerse ms fuertes. No se trata de un trabajo de parto verdadero porque no producen un agrandamiento (dilatacin) y afinamiento del cuello del tero. Algunas veces resulta difcil distinguirlas del trabajo de parto verdadero porque en algunos casos llegan a ser muy intensas y las personas tienen distinta tolerancia al [employer]. No debe sentirse avergonzada si ingresa al hospital con un falso trabajo de [place]. En ocasiones la nica forma de saber si est en un parto verdadero es observar los cambios en el cuello del tero. A veces, la nica forma de saber si realmente est en trabajo de parto es para el mdico observar los cambios en el tero. Como diferenciar el [place] de parto falso del verdadero:  Aggie Cosier de parto falso.  Las contracciones falsas generalmente duran menos y no son tan intensas como las verdaderas.  Generalmente se sienten en la zona inferior del abdomen y  en la ingle.  Pueden aliviarse con una caminata o cambiar de posicin mientras se est acostada.  A medida que pasa el tiempo son ms cortas y dbiles.  Generalmente son irregulares.  No se hacen progresivamente ms intensas y Herbalist entre s [employer].  Trabajo de parto verdadero.  Las contracciones verdaderas duran de 30 a 70 segundos, son ms regulares, generalmente se hacen ms intensas y Comptroller.  No desaparecen al caminar.  La molestia generalmente se siente en la parte superior del tero y se extiende hacia la zona inferior del abdomen y [employer] cintura.  El  profesional que la asiste podr examinarla para determinar si el trabajo de parto es verdadero. El examen mostrar si el cuello del tero se est dilatando y [employer]. Si no hay problemas prenatales u otras complicaciones de la salud asociadas al embarazo, no habr inconvenientes si la envan a su casa y espera el comienzo del verdadero trabajo de [place]. INSTRUCCIONES PARA EL CUIDADO DOMICILIARIO  Siga con los ejercicios y las indicaciones habituales.  Tome los medicamentos como se le indic.  Cumpla con las citas regularmente.  Coma y beba ligero si cree que dar a luz.  Si se siente incmoda por las contracciones:  Cambie de [place], si est acostada o en reposo, camine y si est caminando, repose.  Sintense y repose en una baadera con agua caliente.  Beba entre 2 y 3 vasos de [place]. La deshidratacin puede causar contracciones BH.  Respire lenta y profundamente varias veces por hora. SOLICITE ATENCIN MDICA DE INMEDIATO SI:  Las contracciones se intensifican, se hacen ms regulares y [employer] s.  Tiene una prdida importante de lquido de la vagina  La temperatura oral se eleva sin motivo por encima de 102 F (38.9 C) o segn le indique el profesional que la asiste.  Elimina una mucosidad sanguinolenta.  Presenta hemorragia vaginal.  Presenta dolor abdominal constante.  Siente un dolor en la parte baja de la espalda que nunca haba sentido antes.  Siente que el beb empuja hacia abajo y le causa presin plvica.  El beb no se mueve tanto como antes. Document Released: 03/19/2005 Document Revised: 09/01/2011 [facility] Patient Information 2013 [place], [place].

## 2012-07-15 NOTE — Progress Notes (Signed)
Seen in [facility]U 07/08/12 for UC's, rare, VE long and closed. 1-3 /hour now. Declines VE today. Risks of uterine rupture reviewed.

## 2012-07-29 ENCOUNTER — Ambulatory Visit (INDEPENDENT_AMBULATORY_CARE_PROVIDER_SITE_OTHER): Payer: Self-pay | Admitting: Family

## 2012-07-29 ENCOUNTER — Encounter: Payer: Self-pay | Admitting: Family

## 2012-07-29 VITALS — BP 128/79 | Temp 97.1°F | Wt 151.0 lb

## 2012-07-29 DIAGNOSIS — O34219 Maternal care for unspecified type scar from previous cesarean delivery: Secondary | ICD-10-CM

## 2012-07-29 DIAGNOSIS — O09219 Supervision of pregnancy with history of pre-term labor, unspecified trimester: Secondary | ICD-10-CM

## 2012-07-29 DIAGNOSIS — Z23 Encounter for immunization: Secondary | ICD-10-CM

## 2012-07-29 DIAGNOSIS — O09899 Supervision of other high risk pregnancies, unspecified trimester: Secondary | ICD-10-CM

## 2012-07-29 LAB — POCT URINALYSIS DIP (DEVICE)
Bilirubin Urine: NEGATIVE
Glucose, UA: NEGATIVE mg/dL
Hgb urine dipstick: NEGATIVE
Leukocytes, UA: NEGATIVE
Nitrite: NEGATIVE
pH: 6.5 (ref 5.0–8.0)

## 2012-07-29 MED ORDER — TETANUS-DIPHTH-ACELL PERTUSSIS 5-2.5-18.5 LF-MCG/0.5 IM SUSP: 0.5000 mL | Freq: Once | INTRAMUSCULAR | Status: DC

## 2012-07-29 NOTE — Progress Notes (Signed)
Hx of presumed classical csection > repeat scheduled on 2/18 0930 Constant at 37 wks

## 2012-07-29 NOTE — Progress Notes (Signed)
Reports three braxton hicks yesterday; no bleeding; discussed importance of reporting vaginal bleeding or consistent pain.

## 2012-07-29 NOTE — Addendum Note (Signed)
Addended by: Soyla Murphy T on: 07/29/2012 12:59 PM   Modules accepted: Orders

## 2012-07-29 NOTE — Progress Notes (Signed)
Pulse-64   Edema-feet  Pain- in front of stomach

## 2012-07-29 NOTE — Addendum Note (Signed)
Addended by: Gerome Apley on: 07/29/2012 09:26 AM   Modules accepted: Orders

## 2012-08-04 ENCOUNTER — Encounter: Payer: Self-pay | Admitting: Family

## 2012-08-05 ENCOUNTER — Encounter: Payer: Self-pay | Admitting: Family

## 2012-08-06 ENCOUNTER — Encounter (HOSPITAL_COMMUNITY): Payer: Self-pay

## 2012-08-09 ENCOUNTER — Encounter (HOSPITAL_COMMUNITY)
Admission: RE | Admit: 2012-08-09 | Discharge: 2012-08-09 | Disposition: A | Discharge status: Home / Self Care | Payer: Medicaid Other | Source: Ambulatory Visit | Attending: Obstetrics and Gynecology | Admitting: Obstetrics and Gynecology

## 2012-08-09 ENCOUNTER — Encounter (HOSPITAL_COMMUNITY): Payer: Self-pay

## 2012-08-09 VITALS — BP 118/70 | Ht <= 58 in | Wt 155.0 lb

## 2012-08-09 DIAGNOSIS — O34219 Maternal care for unspecified type scar from previous cesarean delivery: Secondary | ICD-10-CM

## 2012-08-09 DIAGNOSIS — O0990 Supervision of high risk pregnancy, unspecified, unspecified trimester: Secondary | ICD-10-CM

## 2012-08-09 HISTORY — DX: Other specified health status: Z78.9

## 2012-08-09 LAB — TYPE AND SCREEN: ABO/RH(D): O POS

## 2012-08-09 LAB — CBC
HCT: 39.2 % (ref 36.0–46.0)
Hemoglobin: 13 g/dL (ref 12.0–15.0)
RBC: 4.31 MIL/uL (ref 3.87–5.11)

## 2012-08-09 LAB — ABO/RH: ABO/RH(D): O POS

## 2012-08-09 LAB — RPR: RPR Ser Ql: NONREACTIVE

## 2012-08-09 NOTE — Patient Instructions (Addendum)
Your procedure is scheduled on:08/10/12  Enter through the Main Entrance at :8am  Pick up desk phone and dial [phone] and inform us of your arrival.  Please call [phone] if you have any problems the morning of surgery.  Remember: Do not eat or drink after midnight:tonight   DO NOT wear jewelry, eye make-up, lipstick,body lotion, or dark fingernail polish. Do not shave for 48 hours prior to surgery.  If you are to be admitted after surgery, leave suitcase in car until your room has been assigned. Patients discharged on the day of surgery will not be allowed to drive home.

## 2012-08-09 NOTE — Pre-Procedure Instructions (Signed)
Eda Royal, Spanish interpreter, present to obtain info and to give pt  Spanish inst.

## 2012-08-09 NOTE — Pre-Procedure Instructions (Signed)
Dr. Rodman Pickle made aware of pt's low platelet count of 132,000. She said "ok, no need to repeat"

## 2012-08-10 ENCOUNTER — Encounter (HOSPITAL_COMMUNITY)
Admission: RE | Disposition: A | Discharge status: Home / Self Care | Payer: Self-pay | Source: Ambulatory Visit | Attending: Obstetrics and Gynecology

## 2012-08-10 ENCOUNTER — Inpatient Hospital Stay (HOSPITAL_COMMUNITY)
Admission: RE | Admit: 2012-08-10 | Discharge: 2012-08-13 | DRG: 766 | Disposition: A | Discharge status: Home / Self Care | Payer: Medicaid Other | Source: Ambulatory Visit | Attending: Obstetrics and Gynecology | Admitting: Obstetrics and Gynecology

## 2012-08-10 ENCOUNTER — Encounter (HOSPITAL_COMMUNITY): Payer: Self-pay | Admitting: *Deleted

## 2012-08-10 ENCOUNTER — Encounter (HOSPITAL_COMMUNITY): Payer: Self-pay | Admitting: Anesthesiology

## 2012-08-10 ENCOUNTER — Inpatient Hospital Stay (HOSPITAL_COMMUNITY): Payer: Medicaid Other | Admitting: Anesthesiology

## 2012-08-10 DIAGNOSIS — O34219 Maternal care for unspecified type scar from previous cesarean delivery: Secondary | ICD-10-CM

## 2012-08-10 DIAGNOSIS — Z01818 Encounter for other preprocedural examination: Secondary | ICD-10-CM

## 2012-08-10 DIAGNOSIS — Z98891 History of uterine scar from previous surgery: Secondary | ICD-10-CM

## 2012-08-10 DIAGNOSIS — Z01812 Encounter for preprocedural laboratory examination: Secondary | ICD-10-CM

## 2012-08-10 DIAGNOSIS — O0990 Supervision of high risk pregnancy, unspecified, unspecified trimester: Secondary | ICD-10-CM

## 2012-08-10 SURGERY — Surgical Case
Anesthesia: Spinal | Site: Abdomen | Wound class: Clean Contaminated

## 2012-08-10 MED ORDER — CEFAZOLIN SODIUM-DEXTROSE 2-3 GM-% IV SOLR
INTRAVENOUS | Status: DC | PRN
  Administered 2012-08-10: 2 g via INTRAVENOUS

## 2012-08-10 MED ORDER — DIPHENHYDRAMINE HCL 25 MG PO CAPS
25.0000 mg | ORAL_CAPSULE | ORAL | Status: DC | PRN
  Filled 2012-08-10: qty 1

## 2012-08-10 MED ORDER — SODIUM CHLORIDE 0.9 % IJ SOLN: 3.0000 mL | INTRAMUSCULAR | Status: DC | PRN

## 2012-08-10 MED ORDER — TETANUS-DIPHTH-ACELL PERTUSSIS 5-2.5-18.5 LF-MCG/0.5 IM SUSP: 0.5000 mL | Freq: Once | INTRAMUSCULAR | Status: DC

## 2012-08-10 MED ORDER — DIBUCAINE 1 % RE OINT: 1.0000 "application " | TOPICAL_OINTMENT | RECTAL | Status: DC | PRN

## 2012-08-10 MED ORDER — SCOPOLAMINE 1 MG/3DAYS TD PT72
MEDICATED_PATCH | TRANSDERMAL | Status: AC
  Administered 2012-08-10: 1.5 mg via TRANSDERMAL
  Filled 2012-08-10: qty 1

## 2012-08-10 MED ORDER — IBUPROFEN 600 MG PO TABS
600.0000 mg | ORAL_TABLET | Freq: Four times a day (QID) | ORAL | Status: DC
  Administered 2012-08-11 – 2012-08-13 (×10): 600 mg via ORAL
  Filled 2012-08-10 (×10): qty 1

## 2012-08-10 MED ORDER — ONDANSETRON HCL 4 MG/2ML IJ SOLN: 4.0000 mg | Freq: Three times a day (TID) | INTRAMUSCULAR | Status: DC | PRN

## 2012-08-10 MED ORDER — SIMETHICONE 80 MG PO CHEW: 80.0000 mg | CHEWABLE_TABLET | ORAL | Status: DC | PRN

## 2012-08-10 MED ORDER — KETOROLAC TROMETHAMINE 30 MG/ML IJ SOLN
30.0000 mg | Freq: Four times a day (QID) | INTRAMUSCULAR | Status: AC | PRN
  Administered 2012-08-10: 30 mg via INTRAVENOUS
  Filled 2012-08-10: qty 1

## 2012-08-10 MED ORDER — DIPHENHYDRAMINE HCL 50 MG/ML IJ SOLN: 25.0000 mg | INTRAMUSCULAR | Status: DC | PRN

## 2012-08-10 MED ORDER — METOCLOPRAMIDE HCL 5 MG/ML IJ SOLN: 10.0000 mg | Freq: Three times a day (TID) | INTRAMUSCULAR | Status: DC | PRN

## 2012-08-10 MED ORDER — MORPHINE SULFATE 0.5 MG/ML IJ SOLN
INTRAMUSCULAR | Status: AC
  Filled 2012-08-10: qty 10

## 2012-08-10 MED ORDER — LANOLIN HYDROUS EX OINT: 1.0000 "application " | TOPICAL_OINTMENT | CUTANEOUS | Status: DC | PRN

## 2012-08-10 MED ORDER — FENTANYL CITRATE 0.05 MG/ML IJ SOLN
INTRAMUSCULAR | Status: AC
  Filled 2012-08-10: qty 2

## 2012-08-10 MED ORDER — PRENATAL MULTIVITAMIN CH
1.0000 | ORAL_TABLET | Freq: Every day | ORAL | Status: DC
  Administered 2012-08-11 – 2012-08-13 (×3): 1 via ORAL
  Filled 2012-08-10 (×3): qty 1

## 2012-08-10 MED ORDER — WITCH HAZEL-GLYCERIN EX PADS: 1.0000 "application " | MEDICATED_PAD | CUTANEOUS | Status: DC | PRN

## 2012-08-10 MED ORDER — DIPHENHYDRAMINE HCL 25 MG PO CAPS: 25.0000 mg | ORAL_CAPSULE | Freq: Four times a day (QID) | ORAL | Status: DC | PRN

## 2012-08-10 MED ORDER — FENTANYL CITRATE 0.05 MG/ML IJ SOLN
INTRAMUSCULAR | Status: DC | PRN
  Administered 2012-08-10: 15 ug via INTRATHECAL

## 2012-08-10 MED ORDER — NALBUPHINE HCL 10 MG/ML IJ SOLN: 5.0000 mg | INTRAMUSCULAR | Status: DC | PRN

## 2012-08-10 MED ORDER — CEFAZOLIN SODIUM-DEXTROSE 2-3 GM-% IV SOLR: 2.0000 g | INTRAVENOUS | Status: DC

## 2012-08-10 MED ORDER — MIDAZOLAM HCL 2 MG/2ML IJ SOLN: 0.5000 mg | Freq: Once | INTRAMUSCULAR | Status: DC | PRN

## 2012-08-10 MED ORDER — ACETAMINOPHEN 10 MG/ML IV SOLN
1000.0000 mg | Freq: Four times a day (QID) | INTRAVENOUS | Status: AC | PRN
  Administered 2012-08-10: 1000 mg via INTRAVENOUS
  Filled 2012-08-10 (×2): qty 100

## 2012-08-10 MED ORDER — OXYTOCIN 40 UNITS IN LACTATED RINGERS INFUSION - SIMPLE MED: 62.5000 mL/h | INTRAVENOUS | Status: AC

## 2012-08-10 MED ORDER — ONDANSETRON HCL 4 MG/2ML IJ SOLN: 4.0000 mg | INTRAMUSCULAR | Status: DC | PRN

## 2012-08-10 MED ORDER — MEPERIDINE HCL 25 MG/ML IJ SOLN: 6.2500 mg | INTRAMUSCULAR | Status: DC | PRN

## 2012-08-10 MED ORDER — MORPHINE SULFATE (PF) 0.5 MG/ML IJ SOLN
INTRAMUSCULAR | Status: DC | PRN
  Administered 2012-08-10: .1 mg via INTRATHECAL

## 2012-08-10 MED ORDER — EPHEDRINE SULFATE 50 MG/ML IJ SOLN
INTRAMUSCULAR | Status: DC | PRN
  Administered 2012-08-10: 10 mg via INTRAVENOUS

## 2012-08-10 MED ORDER — NALOXONE HCL 0.4 MG/ML IJ SOLN: 0.4000 mg | INTRAMUSCULAR | Status: DC | PRN

## 2012-08-10 MED ORDER — DIPHENHYDRAMINE HCL 50 MG/ML IJ SOLN: 12.5000 mg | INTRAMUSCULAR | Status: DC | PRN

## 2012-08-10 MED ORDER — MORPHINE SULFATE (PF) 0.5 MG/ML IJ SOLN
INTRAMUSCULAR | Status: DC | PRN
  Administered 2012-08-10: .5 mg via INTRAVENOUS

## 2012-08-10 MED ORDER — ONDANSETRON HCL 4 MG/2ML IJ SOLN
INTRAMUSCULAR | Status: AC
  Filled 2012-08-10: qty 2

## 2012-08-10 MED ORDER — SIMETHICONE 80 MG PO CHEW
80.0000 mg | CHEWABLE_TABLET | Freq: Three times a day (TID) | ORAL | Status: DC
  Administered 2012-08-10 – 2012-08-13 (×8): 80 mg via ORAL

## 2012-08-10 MED ORDER — KETOROLAC TROMETHAMINE 30 MG/ML IJ SOLN
INTRAMUSCULAR | Status: AC
  Filled 2012-08-10: qty 1

## 2012-08-10 MED ORDER — MENTHOL 3 MG MT LOZG: 1.0000 | LOZENGE | OROMUCOSAL | Status: DC | PRN

## 2012-08-10 MED ORDER — OXYTOCIN 10 UNIT/ML IJ SOLN
INTRAMUSCULAR | Status: AC
  Filled 2012-08-10: qty 4

## 2012-08-10 MED ORDER — DEXTROSE 5 % IV SOLN: 1.0000 ug/kg/h | INTRAVENOUS | Status: DC | PRN

## 2012-08-10 MED ORDER — LACTATED RINGERS IV SOLN
INTRAVENOUS | Status: DC
  Administered 2012-08-10 (×5): via INTRAVENOUS

## 2012-08-10 MED ORDER — KETOROLAC TROMETHAMINE 30 MG/ML IJ SOLN: 30.0000 mg | Freq: Four times a day (QID) | INTRAMUSCULAR | Status: AC | PRN

## 2012-08-10 MED ORDER — ONDANSETRON HCL 4 MG/2ML IJ SOLN
INTRAMUSCULAR | Status: DC | PRN
  Administered 2012-08-10: 4 mg via INTRAVENOUS

## 2012-08-10 MED ORDER — PROMETHAZINE HCL 25 MG/ML IJ SOLN: 6.2500 mg | INTRAMUSCULAR | Status: DC | PRN

## 2012-08-10 MED ORDER — FENTANYL CITRATE 0.05 MG/ML IJ SOLN: 25.0000 ug | INTRAMUSCULAR | Status: DC | PRN

## 2012-08-10 MED ORDER — BUPIVACAINE IN DEXTROSE 0.75-8.25 % IT SOLN
INTRATHECAL | Status: DC | PRN
  Administered 2012-08-10: 1.1 mL via INTRATHECAL

## 2012-08-10 MED ORDER — OXYTOCIN 10 UNIT/ML IJ SOLN
40.0000 [IU] | INTRAVENOUS | Status: DC | PRN
  Administered 2012-08-10: 40 [IU] via INTRAVENOUS

## 2012-08-10 MED ORDER — OXYCODONE-ACETAMINOPHEN 5-325 MG PO TABS
1.0000 | ORAL_TABLET | ORAL | Status: DC | PRN
  Administered 2012-08-10 – 2012-08-13 (×8): 1 via ORAL
  Filled 2012-08-10 (×8): qty 1

## 2012-08-10 MED ORDER — SCOPOLAMINE 1 MG/3DAYS TD PT72
1.0000 | MEDICATED_PATCH | TRANSDERMAL | Status: DC
Start: 2012-08-10 — End: 2012-08-10
  Administered 2012-08-10: 1.5 mg via TRANSDERMAL

## 2012-08-10 MED ORDER — SENNOSIDES-DOCUSATE SODIUM 8.6-50 MG PO TABS
2.0000 | ORAL_TABLET | Freq: Every day | ORAL | Status: DC
  Administered 2012-08-10 – 2012-08-12 (×3): 2 via ORAL

## 2012-08-10 MED ORDER — LACTATED RINGERS IV SOLN: INTRAVENOUS | Status: DC

## 2012-08-10 MED ORDER — ONDANSETRON HCL 4 MG PO TABS: 4.0000 mg | ORAL_TABLET | ORAL | Status: DC | PRN

## 2012-08-10 MED ORDER — SCOPOLAMINE 1 MG/3DAYS TD PT72: 1.0000 | MEDICATED_PATCH | Freq: Once | TRANSDERMAL | Status: DC

## 2012-08-10 SURGICAL SUPPLY — 31 items
BENZOIN TINCTURE PRP APPL 2/3 (GAUZE/BANDAGES/DRESSINGS) ×2 IMPLANT
CONTAINER PREFILL 10% NBF 15ML (MISCELLANEOUS) IMPLANT
DRAPE LG THREE QUARTER DISP (DRAPES) ×2 IMPLANT
DRESSING TELFA 8X3 (GAUZE/BANDAGES/DRESSINGS) IMPLANT
DRSG OPSITE POSTOP 4X10 (GAUZE/BANDAGES/DRESSINGS) ×2 IMPLANT
DURAPREP 26ML APPLICATOR (WOUND CARE) ×2 IMPLANT
ELECT REM PT RETURN 9FT ADLT (ELECTROSURGICAL) ×2
ELECTRODE REM PT RTRN 9FT ADLT (ELECTROSURGICAL) ×1 IMPLANT
EXTRACTOR VACUUM M CUP 4 TUBE (SUCTIONS) IMPLANT
GAUZE SPONGE 4X4 12PLY STRL LF (GAUZE/BANDAGES/DRESSINGS) IMPLANT
GLOVE BIOGEL PI IND STRL 6.5 (GLOVE) ×2 IMPLANT
GLOVE BIOGEL PI INDICATOR 6.5 (GLOVE) ×2
GLOVE SURG SS PI 6.0 STRL IVOR (GLOVE) ×2 IMPLANT
GOWN PREVENTION PLUS LG XLONG (DISPOSABLE) ×6 IMPLANT
KIT ABG SYR 3ML LUER SLIP (SYRINGE) IMPLANT
NEEDLE HYPO 25X5/8 SAFETYGLIDE (NEEDLE) IMPLANT
NS IRRIG 1000ML POUR BTL (IV SOLUTION) ×2 IMPLANT
PACK C SECTION WH (CUSTOM PROCEDURE TRAY) ×2 IMPLANT
PAD ABD 7.5X8 STRL (GAUZE/BANDAGES/DRESSINGS) IMPLANT
PAD OB MATERNITY 4.3X12.25 (PERSONAL CARE ITEMS) ×2 IMPLANT
RTRCTR C-SECT PINK 25CM LRG (MISCELLANEOUS) ×2 IMPLANT
SEPRAFILM MEMBRANE 5X6 (MISCELLANEOUS) IMPLANT
SLEEVE SCD COMPRESS KNEE MED (MISCELLANEOUS) IMPLANT
STAPLER VISISTAT 35W (STAPLE) ×2 IMPLANT
STRIP CLOSURE SKIN 1/2X4 (GAUZE/BANDAGES/DRESSINGS) ×2 IMPLANT
SUT PLAIN 0 NONE (SUTURE) IMPLANT
SUT VIC AB 0 CT1 36 (SUTURE) ×8 IMPLANT
SUT VIC AB 4-0 KS 27 (SUTURE) ×2 IMPLANT
TOWEL OR 17X24 6PK STRL BLUE (TOWEL DISPOSABLE) ×6 IMPLANT
TRAY FOLEY CATH 14FR (SET/KITS/TRAYS/PACK) ×2 IMPLANT
WATER STERILE IRR 1000ML POUR (IV SOLUTION) IMPLANT

## 2012-08-10 NOTE — OR Nursing (Signed)
EDA -spanish interpretor  Interprets for pt in pacu shortly upon arrival to pacu and translates at discharge . Margarita Mail rn

## 2012-08-10 NOTE — Anesthesia Postprocedure Evaluation (Signed)
  Anesthesia Post Note  Patient: Kathryn Glenn  Procedure(s) Performed: Procedure(s) (LRB): CESAREAN SECTION (N/A)  Anesthesia type: Spinal  Patient location: PACU  Post pain: Pain level controlled  Post assessment: Post-op Vital signs reviewed  Last Vitals:  Filed Vitals:   08/10/12 1115  BP: 146/86  Pulse: 53  Temp:   Resp: 16    Post vital signs: Reviewed  Level of consciousness: awake  Complications: No apparent anesthesia complications

## 2012-08-10 NOTE — Op Note (Signed)
Jeremy Johann PROCEDURE DATE: 08/10/2012  PREOPERATIVE DIAGNOSIS: Intrauterine pregnancy at  [redacted]w[redacted]d weeks gestation; with history of previous classical c-section  POSTOPERATIVE DIAGNOSIS: The same  PROCEDURE:     Cesarean Section  SURGEON:  Dr. Catalina Antigua  ASSISTANT: none  INDICATIONS: Kathryn Glenn is a 32 y.o. [phone] at [redacted]w[redacted]d scheduled for cesarean section secondary to previous uterine incision classical.  The risks of cesarean section discussed with the patient included but were not limited to: bleeding which may require transfusion or reoperation; infection which may require antibiotics; injury to bowel, bladder, ureters or other surrounding organs; injury to the fetus; need for additional procedures including hysterectomy in the event of a life-threatening hemorrhage; placental abnormalities wth subsequent pregnancies, incisional problems, thromboembolic phenomenon and other postoperative/anesthesia complications. The patient concurred with the proposed plan, giving informed written consent for the procedure.    FINDINGS:  Viable female infant in cephalic presentation.  Apgars 9 and 9.  Clear amniotic fluid.  Intact placenta, three vessel cord.  Normal uterus, fallopian tubes and ovaries bilaterally.  ANESTHESIA:    Spinal INTRAVENOUS FLUIDS:3100 ml ESTIMATED BLOOD LOSS: 500 ml URINE OUTPUT:  600 ml SPECIMENS: Placenta sent to L&D COMPLICATIONS: None immediate  PROCEDURE IN DETAIL:  The patient received intravenous antibiotics and had sequential compression devices applied to her lower extremities while in the preoperative area.  She was then taken to the operating room where anesthesia was induced and was found to be adequate. A foley catheter was placed into her bladder and attached to Chancie Lampert gravity. She was then placed in a dorsal supine position with a leftward tilt, and prepped and draped in a sterile manner. After an adequate timeout was performed, a  Pfannenstiel skin incision was made with scalpel and carried through to the underlying layer of fascia. The fascia was incised in the midline and this incision was extended bilaterally using the Mayo scissors. Kocher clamps were applied to the superior aspect of the fascial incision and the underlying rectus muscles were dissected off bluntly. A similar process was carried out on the inferior aspect of the facial incision. The rectus muscles were separated in the midline bluntly and the peritoneum was entered bluntly. The Alexis self-retaining retractor was introduced into the abdominal cavity. Attention was turned to the lower uterine segment where a bladder flap was created, and a transverse hysterotomy was made with a scalpel and extended bilaterally bluntly. The infant was successfully delivered, and cord was clamped and cut and infant was handed over to awaiting neonatology team. Uterine massage was then administered and the placenta delivered intact with three-vessel cord. The uterus was cleared of clot and debris.  The hysterotomy was closed with 0 Vicryl in a running locked fashion, and an imbricating layer was also placed with a 0 Vicryl. Overall, excellent hemostasis was noted. The pelvis copiously irrigated and cleared of all clot and debris. Hemostasis was confirmed on all surfaces.  The peritoneum and the muscles were reapproximated using 0 vicryl interrupted stitches. The fascia was then closed using 0 Vicryl in a running locked fashion.  The subcutaneous layer was reapproximated with plain gut and the skin was closed in a subcuticular fashion using 3.0 Vicryl. The patient tolerated the procedure well. Sponge, lap, instrument and needle counts were correct x 2. She was taken to the recovery room in stable condition.    Airam Heidecker,PEGGYMD  08/10/2012 10:35 AM

## 2012-08-10 NOTE — Transfer of Care (Signed)
Immediate Anesthesia Transfer of Care Note  Patient: Kathryn Glenn  Procedure(s) Performed: Procedure(s) with comments: CESAREAN SECTION (N/A) - Repeat  Patient Location: PACU  Anesthesia Type:Spinal  Level of Consciousness: awake, alert  and oriented  Airway & Oxygen Therapy: Patient Spontanous Breathing  Post-op Assessment: Report given to PACU RN  Post vital signs: Reviewed and stable  Complications: No apparent anesthesia complications

## 2012-08-10 NOTE — Anesthesia Postprocedure Evaluation (Signed)
  Anesthesia Post-op Note  Patient: Kathryn Glenn  Procedure(s) Performed: Procedure(s) with comments: CESAREAN SECTION (N/A) - Repeat  Patient Location: PACU and Mother/Baby  Anesthesia Type:Epidural  Level of Consciousness: awake, alert , oriented and patient cooperative  Airway and Oxygen Therapy: Patient Spontanous Breathing  Post-op Pain: none  Post-op Assessment: Post-op Vital signs reviewed and Patient's Cardiovascular Status Stable  Post-op Vital Signs: Reviewed and stable  Complications: No apparent anesthesia complications

## 2012-08-10 NOTE — H&P (Signed)
Kamilia Mahek Schlesinger is a 32 y.o. female [phone] at 67 w 0 d presenting for scheduled repeat cesarean section. Patient with obstetrical history complicated by 2 NSVD followed by classical c-section at 28 weeks secondary to placental abruption and fetus in transverse position. Patient's current prenatal care has been uncomplicated. Patient is currently without any complaints and is planning on using Mirena IUD for birth control. History OB History   Grav Para Term Preterm Abortions TAB SAB Ect Mult Living   5 3 2 1 1 1    3      Past Medical History  Diagnosis Date  . No pertinent past medical history   . Preterm labor   . Medical history non-contributory    Past Surgical History  Procedure Laterality Date  . Appendectomy    . Cesarean section      C/S x 1   Family History: family history includes Heart disease in her mother. Social History:  reports that she has never smoked. She has never used smokeless tobacco. She reports that she does not drink alcohol or use illicit drugs.   Prenatal Transfer Tool  Maternal Diabetes: No Genetic Screening: Normal Maternal Ultrasounds/Referrals: Normal Fetal Ultrasounds or other Referrals:  None Maternal Substance Abuse:  No Significant Maternal Medications:  None Significant Maternal Lab Results:  None Other Comments:  None  ROS    Blood pressure 146/83, pulse 62, temperature 98.1 F (36.7 C), temperature source Oral, resp. rate 18, last menstrual period 09/26/2011, SpO2 100.00%. Exam Physical Exam  GENERAL: Well-developed, well-nourished female in no acute distress.  HEENT: Normocephalic, atraumatic. Sclerae anicteric.  NECK: Supple. Normal thyroid.  LUNGS: Clear to auscultation bilaterally.  HEART: Regular rate and rhythm. BREASTS: Symmetric in size. No palpable masses or lymphadenopathy, skin changes, or nipple drainage. ABDOMEN: Soft, gravid, nontender. PELVIC: Deferred  EXTREMITIES: No cyanosis, clubbing, or edema, 2+ distal  pulses.  Prenatal labs: ABO, Rh: --/--/O POS (02/17 1030) Antibody: NEG (02/17 1015) Rubella: Immune (10/08 0000) RPR: NON REACTIVE (02/17 1015)  HBsAg: Negative (10/08 0000)  HIV: Non-reactive (10/08 0000)  GBS:   neg on 07/29/2012  Assessment/Plan: 32 yo A5W0981 with history of previous classical c-section presenting today at 37 weeks for repeat c-section - Risks, benefits and alternatives were explained including but not limited to risks of bleeding, infection and damage to adjacent organs Patient verbalized understanding and all questions were answered. Patient planning on breast feeding and using Mirena IUD for birth control.   Donnia Poplaski 08/10/2012, 8:52 AM

## 2012-08-10 NOTE — Anesthesia Procedure Notes (Signed)
Spinal  Patient location during procedure: OR Start time: 08/10/2012 9:31 AM Staffing Performed by: anesthesiologist  Preanesthetic Checklist Completed: patient identified, site marked, surgical consent, pre-op evaluation, timeout performed, IV checked, risks and benefits discussed and monitors and equipment checked Spinal Block Patient position: sitting Prep: site prepped and draped and DuraPrep Patient monitoring: heart rate, continuous pulse ox and blood pressure Approach: midline Location: L3-4 Injection technique: single-shot Needle Needle type: Sprotte  Needle gauge: 24 G Needle length: 9 cm Assessment Sensory level: T4 Additional Notes Clear free flow CSF on first attempt.  No paresthesia.  Patient tolerated procedure well with no apparent complications.  Jasmine December, MD

## 2012-08-10 NOTE — Anesthesia Preprocedure Evaluation (Signed)
Anesthesia Evaluation  Patient identified by MRN, date of birth, ID band Patient awake    Reviewed: Allergy & Precautions, H&P , NPO status , Patient's Chart, lab work & pertinent test results  Airway Mallampati: II      Dental no notable dental hx.    Pulmonary neg pulmonary ROS,  breath sounds clear to auscultation  Pulmonary exam normal       Cardiovascular Exercise Tolerance: Good negative cardio ROS  Rhythm:regular Rate:Normal     Neuro/Psych negative neurological ROS  negative psych ROS   GI/Hepatic negative GI ROS, Neg liver ROS,   Endo/Other  negative endocrine ROS  Renal/GU negative Renal ROS  negative genitourinary   Musculoskeletal   Abdominal Normal abdominal exam  (+)   Peds  Hematology negative hematology ROS (+)   Anesthesia Other Findings   Reproductive/Obstetrics (+) Pregnancy                           Anesthesia Physical Anesthesia Plan  ASA: II  Anesthesia Plan: Spinal   Post-op Pain Management:    Induction:   Airway Management Planned:   Additional Equipment:   Intra-op Plan:   Post-operative Plan:   Informed Consent: I have reviewed the patients History and Physical, chart, labs and discussed the procedure including the risks, benefits and alternatives for the proposed anesthesia with the patient or authorized representative who has indicated his/her understanding and acceptance.     Plan Discussed with: Anesthesiologist, CRNA and Surgeon  Anesthesia Plan Comments:         Anesthesia Quick Evaluation

## 2012-08-11 ENCOUNTER — Encounter (HOSPITAL_COMMUNITY): Payer: Self-pay | Admitting: Obstetrics and Gynecology

## 2012-08-11 LAB — CBC
HCT: 30.8 % — ABNORMAL LOW (ref 36.0–46.0)
Hemoglobin: 10.6 g/dL — ABNORMAL LOW (ref 12.0–15.0)
MCHC: 34.4 g/dL (ref 30.0–36.0)
RBC: 3.41 MIL/uL — ABNORMAL LOW (ref 3.87–5.11)

## 2012-08-11 NOTE — Progress Notes (Signed)
Post Op Day 1 Subjective: no complaints, tolerating PO and + flatus  Ligia Duguay is a 32 y.o. female [phone] who delivered by LTCS yesterday morning at [phone]. She's doing well this morning, with only some complaints of wound site pain. She has not passed stool, but has passed gas. Tolerating PO with no problems. States pain is 3/10 right now. Patient unable to ambulate due to pain. Patient denies SOB, CP, Headache, nausea, pain in calf area.    Objective: Blood pressure 131/75, pulse 64, temperature 97.9 F (36.6 C), temperature source Oral, resp. rate 18, weight 70.308 kg (155 lb), last menstrual period 09/26/2011, SpO2 97.00%, unknown if currently breastfeeding.  Physical Exam:  General: alert, cooperative and no distress Lochia: appropriate Uterine Fundus: soft Incision: healing well, no significant drainage, no dehiscence DVT Evaluation: No evidence of DVT seen on physical exam. Negative Homan's sign.  Recent Labs  08/09/12 1015 08/11/12 0528  HGB 13.0 10.6*  HCT 39.2 30.8*   Assessment/Plan: Breastfeeding and Contraception   Naydene Kamrowski is a 32 y.o. female [phone] doing well overall this morning  Pain: Pain is currently being managed with Motrin and Percocet prn. She needed three perc overnight.  Wound: healing fine.  Ambulation: Still unable to ambulate due to pain. Advised about importance of early ambulation.  Breastfeeding: She's having some difficulties with baby latching on and extracting milk. Advised pt about frequent feedings as well as showed the mother how the baby should latch on. Contraception: Mirena IUD Discharge once pain is controlled, patient has passed stool, and able to ambulate.    LOS: 1 day   [place] PA-S 08/11/2012, 7:32 AM

## 2012-08-11 NOTE — Progress Notes (Signed)
UR chart review completed.  

## 2012-08-11 NOTE — Progress Notes (Signed)
I have seen and examined this patient and I agree with the above.  Plan on d/c tomorrow AM. Cam Hai 9:19 AM 08/11/2012

## 2012-08-12 NOTE — Progress Notes (Signed)
Subjective: Postpartum Day 2: Cesarean Delivery Patient reports tolerating PO and + flatus. Patient reports that she has not had a bowel movement since her c-section, however is passing gas. She reports mild dysuria and increased frequency this AM. She reports she has abdominal pain when getting up to walk, but reports that the pain is more under control than it was yesterday. She has been up from bed, and reports she will get up from bed more today. She reports very light vaginal bleeding and no vaginal discharge. She is tolerating POs well. Patient is breastfeeding, however did supplement a little last night. She is planning on breast feeding the majority of the time.   Objective: Vital signs in last 24 hours: Temp:  [98.3 F (36.8 C)-99.9 F (37.7 C)] 98.3 F (36.8 C) (02/20 0625) Pulse Rate:  [62-101] 62 (02/20 0625) Resp:  [18] 18 (02/20 0625) BP: (109-124)/(68-81) 124/81 mmHg (02/20 0625) SpO2:  [96 %] 96 % (02/19 0930)  Physical Exam:  General: alert, cooperative and no distress Lochia: appropriate Uterine Fundus: firm Incision: healing well, no significant drainage, no significant erythema DVT Evaluation: No evidence of DVT seen on physical exam. Negative Homan's sign. No cords or calf tenderness. No significant calf/ankle edema.   Recent Labs  08/09/12 1015 08/11/12 0528  HGB 13.0 10.6*  HCT 39.2 30.8*    Assessment/Plan: Status post Cesarean section. Doing well postoperatively. Continues to have abdominal discomfort when up to walk. Has not yet had a bowel movement. Complains of dysuria and increased frequency of urination. Continue current care.  Patient plans on getting the Paragard IUD upon discharge.   Adela Glimpse 08/12/2012, 8:44 AM

## 2012-08-13 DIAGNOSIS — Z98891 History of uterine scar from previous surgery: Secondary | ICD-10-CM

## 2012-08-13 HISTORY — DX: History of uterine scar from previous surgery: Z98.891

## 2012-08-13 MED ORDER — IBUPROFEN 600 MG PO TABS: 600.0000 mg | ORAL_TABLET | Freq: Four times a day (QID) | ORAL | Status: DC

## 2012-08-13 MED ORDER — DOCUSATE SODIUM 100 MG PO CAPS: 100.0000 mg | ORAL_CAPSULE | Freq: Two times a day (BID) | ORAL | Status: DC | PRN

## 2012-08-13 MED ORDER — OXYCODONE-ACETAMINOPHEN 5-325 MG PO TABS: 1.0000 | ORAL_TABLET | Freq: Four times a day (QID) | ORAL | Status: DC | PRN

## 2012-08-13 MED ORDER — DSS 100 MG PO CAPS: 100.0000 mg | ORAL_CAPSULE | Freq: Two times a day (BID) | ORAL | Status: DC | PRN

## 2012-08-13 NOTE — Discharge Summary (Addendum)
Post Op Day 3  Kathryn Glenn is a 32 y.o. female [phone] doing well this morning. Passing gas, but has not passed stool yet. Tolerating po and ambulating fine. Mild pain at incision site, no abd pain. Pt claims her R leg is a little swollen at the ankle, but no pain.  Ready to go home.   Obstetric Discharge Summary Reason for Admission: cesarean section and observation/evaluation Prenatal Procedures: none Intrapartum Procedures: cesarean: low cervical, transverse Postpartum Procedures: none Complications-Operative and Postpartum: none  Hospital course: Pt admitted at 37 weeks for RLTCS due to hx of classical incision.  See op note for details, but procedure was uncomplicated.  The pt progressed as expected over the next 2 days, with only complaint being R foot edema last night, much better this am.   Hemoglobin  Date Value Range Status  08/11/2012 10.6* 12.0 - 15.0 g/dL Final     REPEATED TO VERIFY     DELTA CHECK NOTED  03/30/2012 12.3   Final     HCT  Date Value Range Status  08/11/2012 30.8* 36.0 - 46.0 % Final  03/30/2012 36   Final   Physical Exam:  General: alert, cooperative and no distress Lochia: appropriate Uterine Fundus: soft Abdomen: +bowel sounds, soft non-tender.  Incision: healing well, no significant drainage, no significant erythema DVT Evaluation: No evidence of DVT seen on physical exam. Slight swelling in R ankle, no pain. No swelling of R calf.  Negative Homan's sign.   Discharge Diagnoses: Term Pregnancy-delivered  Discharge Information: Date: 08/13/2012 Activity: pelvic rest Diet: routine Medications: Ibuprofen and Colace Condition: stable Instructions: refer to practice specific booklet Discharge to: home  Breastfeeding: improving and she is producing more milk for the baby. No complaints today  Contraception: IUD She would like to be DC'ed today at 12:00p, that's when husband gets out of work.  Provided education on IUD with  handout.  Newborn Data: Live born female  Birth Weight: 5 lb 9.2 oz (2529 g) APGAR:   Home with husband.  [place] 08/13/2012, 7:51 AM

## 2012-08-13 NOTE — Discharge Summary (Signed)
I have seen and examined this patient and agree the above assessment. CRESENZO-DISHMAN,Jupiter Boys 08/13/2012 8:08 AM

## 2012-08-13 NOTE — Progress Notes (Signed)
Attestation of Attending Supervision of Physician Assistant Student: Evaluation and management procedures were performed by the PA Student under my supervision.  I have seen and examined the patient, reviewed the the student's note and chart, and I agree with management and plan.  Callan Norden, MD, FACOG Attending Obstetrician & Gynecologist Faculty Practice, [facility]   

## 2012-08-13 NOTE — Discharge Summary (Signed)
Obstetric Discharge Summary Reason for Admission: cesarean section Prenatal Procedures: none Intrapartum Procedures: RLTCS Postpartum Procedures: none Complications-Operative and Postpartum: none Hemoglobin  Date Value Range Status  08/11/2012 10.6* 12.0 - 15.0 g/dL Final     REPEATED TO VERIFY     DELTA CHECK NOTED  03/30/2012 12.3   Final     HCT  Date Value Range Status  08/11/2012 30.8* 36.0 - 46.0 % Final  03/30/2012 36   Final   Patient admitted for scheduled repeat cesarean section at 37 weeks because of history of classical cesarean section.  Patient had an uncomplicated surgery; for further details of this surgery, please refer to the operative note. Furthermore, the patient had an uncomplicated postoperative course.  By time of discharge, her pain was controlled on oral pain medications; she was ambulating, voiding without difficulty, tolerating regular diet and passing flatus.  She was deemed stable for discharge to home.    Physical Exam:  BP 119/72  Pulse 77  Temp(Src) 98.1 F (36.7 C) (Oral)  Resp 18  Wt 155 lb (70.308 kg)  BMI 32.4 kg/m2  SpO2 96%  LMP 09/26/2011 General: alert and no distress Lochia: appropriate Uterine Fundus: firm Incision: healing well, no significant drainage DVT Evaluation: No evidence of DVT seen on physical exam. Negative Homan's sign.  Discharge Diagnoses: Term Pregnancy-delivered  Discharge Information: Date: 08/13/2012 Activity: Refer to discharge instructions Diet: routine Medications: Ibuprofen, Colace and Percocet Condition: stable Instructions:  Refer to discharge instructions Discharge to: home   Newborn Data: Live born female  Birth Weight: 5 lb 9.2 oz (2529 g) Apgars 9 and 9  Home with mother.  Khianna Blazina A 08/13/2012, 9:17 AM

## 2012-08-13 NOTE — Progress Notes (Deleted)
Post Partum Day 1 S/P RLTCS Subjective: no complaints, up ad lib, voiding and tolerating PO, small lochia, plans to breastfeed, unsure contraception  Objective: Blood pressure 119/72, pulse 77, temperature 98.1 F (36.7 C), temperature source Oral, resp. rate 18, weight 155 lb (70.308 kg), last menstrual period 09/26/2011, SpO2 96.00%, unknown if currently breastfeeding.  Physical Exam:  General: alert, cooperative and no distress Lochia:normal flow Chest: CTAB Heart: RRR no m/r/g Abdomen: +BS, soft, nontender, Dressing dry/intact Uterine Fundus: firm Lochia small DVT Evaluation: No evidence of DVT seen on physical exam. Extremities: trace edema   Recent Labs  08/11/12 0528  HGB 10.6*  HCT 30.8*    Assessment/Plan: Lactation consult, expectant management   LOS: 3 days   CRESENZO-DISHMAN,Kenyon Eshleman 08/13/2012, 8:04 AM

## 2012-08-13 NOTE — Discharge Summary (Signed)
Attestation of Attending Supervision of Advanced Practitioner (PA/CNM/NP): Evaluation and management procedures were performed by the Advanced Practitioner under my supervision and collaboration.  I have reviewed the Advanced Practitioner's note and chart, and I agree with the management and plan.  Laurynn Mccorvey, MD, FACOG Attending Obstetrician & Gynecologist Faculty Practice, [facility]  

## 2012-09-06 ENCOUNTER — Ambulatory Visit (INDEPENDENT_AMBULATORY_CARE_PROVIDER_SITE_OTHER): Payer: Self-pay | Admitting: Obstetrics and Gynecology

## 2012-09-06 VITALS — BP 138/82 | HR 67 | Temp 97.5°F | Wt 143.0 lb

## 2012-09-06 DIAGNOSIS — Z8659 Personal history of other mental and behavioral disorders: Secondary | ICD-10-CM | POA: Insufficient documentation

## 2012-09-06 DIAGNOSIS — F53 Postpartum depression: Secondary | ICD-10-CM | POA: Insufficient documentation

## 2012-09-06 NOTE — Progress Notes (Signed)
  Subjective:     Kathryn Glenn is a 32 y.o. female [phone] who presents for a postpartum visit. She is 4 weeks postpartum following a low cervical transverse Cesarean section. I have fully reviewed the prenatal and intrapartum course. The delivery was at 37 gestational weeks. Outcome: repeat cesarean section, low transverse incision. Anesthesia: spinal. Postpartum course has been complicated by situational depression. Reports history of depression that was never treated, and current situational depression r/t immigration issues with her children. Very reluctant to discuss. Baby's course has been uncomplicated. Baby is feeding by breast. Bleeding thin lochia. Bowel function is normal. Bladder function is normal. Patient is not sexually active. Contraception method is none and is interested in [place]. Postpartum depression screening: positive.  Stays at home with husband and children, and another adult. Does not work and husband does not work. Is concerned about her living situation and wants to move.   Review of Systems A comprehensive review of systems was negative.   Objective:    BP 138/82  Pulse 67  Temp(Src) 97.5 F (36.4 C) (Oral)  Wt 143 lb (64.864 kg)  BMI 29.89 kg/m2  Breastfeeding? Yes  General:  alert and mild distress   Breasts:  lactational  Lungs: clear to auscultation bilaterally  Heart:  regular rate and rhythm, S1, S2 normal, no murmur, click, rub or gallop  Abdomen: soft, non-tender; bowel sounds normal; no masses,  no organomegaly Incision well approximated, no swelling/drainage   Vulva:  normal  Vagina: not evaluated  Cervix:  not evaluated  Corpus: not examined  Adnexa:  not evaluated  Rectal Exam: Not performed.        Assessment:   A5W0981 here for 4 week postpartum exam s/p repeat cesarean delivery.  Pap smear not needed at today's visit.  Depression with EPDS 11, denies suicidal or homicidal ideations.   Plan:    1. Contraception: wants IUD,  unable to pay. Will fill out application for Mirena scholarship, will use condoms in meantime. 2. Depression-social work not available to talk with patient today, will call patient. Pt instructed to come to hospital for any suicidal/homicidal ideations. Social worker to assist pt in finding resources to help with depression since she has no insurance or money.  3. Follow up in: 1 year or as needed.  Evaluation and management procedures were performed by SNM under my supervision/collaboration. Chart reviewed, patient examined by me and I agree with management and plan.

## 2012-09-10 NOTE — Progress Notes (Signed)
CSW has attempted to call patient with the assistance of [employer], but receives a message on both numbers listed for patient that no calls can be received at this time.  Leaving a message was not given as an option.  If patient returns to clinic or calls wishing to speak with CSW, please instruct her to call our Spanish Interpreter at [phone] so CSW can return her call.

## 2012-09-15 NOTE — Progress Notes (Signed)
Scheduled PP visit. [facility] today (not seen)

## 2014-04-24 ENCOUNTER — Encounter (HOSPITAL_COMMUNITY): Payer: Self-pay | Admitting: Obstetrics and Gynecology

## 2015-04-20 LAB — GLUCOSE, POCT (MANUAL RESULT ENTRY): POC Glucose: 104 mg/dl — AB (ref 70–99)

## 2015-12-20 ENCOUNTER — Emergency Department (HOSPITAL_COMMUNITY)
Admission: EM | Admit: 2015-12-20 | Discharge: 2015-12-20 | Disposition: A | Discharge status: Home / Self Care | Payer: Self-pay | Attending: Emergency Medicine | Admitting: Emergency Medicine

## 2015-12-20 ENCOUNTER — Encounter (HOSPITAL_COMMUNITY): Payer: Self-pay | Admitting: Emergency Medicine

## 2015-12-20 DIAGNOSIS — H5713 Ocular pain, bilateral: Secondary | ICD-10-CM | POA: Insufficient documentation

## 2015-12-20 MED ORDER — ERYTHROMYCIN 5 MG/GM OP OINT
1.0000 "application " | TOPICAL_OINTMENT | Freq: Once | OPHTHALMIC | Status: AC
  Administered 2015-12-20: 1 via OPHTHALMIC
  Filled 2015-12-20: qty 3.5

## 2015-12-20 MED ORDER — KETOROLAC TROMETHAMINE 60 MG/2ML IM SOLN
60.0000 mg | Freq: Once | INTRAMUSCULAR | Status: AC
  Administered 2015-12-20: 60 mg via INTRAMUSCULAR
  Filled 2015-12-20: qty 2

## 2015-12-20 NOTE — ED Notes (Addendum)
Pt in with c/o headache starting last night at 7pm. Pt was unable to sleep d/t pain, took 2 Tylenol at 2am, feels some relief now. Pt's eyes are red, reports sensitivity to light, denies n/v. Reports sharp pain at top of head and behind eyes.

## 2015-12-20 NOTE — ED Provider Notes (Signed)
CSN: [id number]     Arrival date & time 12/20/15  0335 History   First MD Initiated Contact with Patient 12/20/15 0421     Chief Complaint  Patient presents with  . Headache     (Consider location/radiation/quality/duration/timing/severity/associated sxs/prior Treatment) HPI  Kathryn Glenn is a 35 y.o. female with past medical history of migraines, presenting today with headache. Patient states she's had a headache for the past 3 months off and on. This feels like her normal headache but has not become more severe in the last 2 weeks. She has had photophobia, clear drainage from her eye. She took 2 Tylenol and this relieved her headache. She presents because she continues to have bilateral eye pain. There is also a lesion on her right eyes she has concern for possible cataracts. She denies any blurry vision. She has not seen ophthalmologist. There are no further complaints. Patient denies any neurological symptoms.  10 Systems reviewed and are negative for acute change except as noted in the HPI.     Past Medical History  Diagnosis Date  . No pertinent past medical history   . Preterm labor   . Medical history non-contributory    Past Surgical History  Procedure Laterality Date  . Appendectomy    . Cesarean section      C/S x 1  . Cesarean section N/A 08/10/2012    Procedure: CESAREAN SECTION;  Surgeon: Catalina AntiguaPeggy Constant, MD;  Location: [facility];  Service: Obstetrics;  Laterality: N/A;  Repeat   Family History  Problem Relation Age of Onset  . Heart disease Mother    Social History  Substance Use Topics  . Smoking status: Never Smoker   . Smokeless tobacco: Never Used  . Alcohol Use: No   OB History    Gravida Para Term Preterm AB TAB SAB Ectopic Multiple Living   5 4 3 1 1 1    4      Review of Systems    Allergies  Review of patient's allergies indicates no known allergies.  Home Medications   Prior to Admission medications   Medication Sig Start Date End  Date Taking? Authorizing Provider  docusate sodium 100 MG CAPS Take 100 mg by mouth 2 (two) times daily as needed. 08/13/12   Napoleon FormPamela Ferry, MD  ibuprofen (ADVIL,MOTRIN) 600 MG tablet Take 1 tablet (600 mg total) by mouth every 6 (six) hours. 08/13/12   Tereso NewcomerUgonna A Anyanwu, MD  oxyCODONE-acetaminophen (PERCOCET/ROXICET) 5-325 MG per tablet Take 1-2 tablets by mouth every 6 (six) hours as needed for pain. 08/13/12   Tereso NewcomerUgonna A Anyanwu, MD  Prenatal Vit-Fe Fumarate-FA (PRENATAL VITAMINS) 28-0.8 MG TABS Take 1 tablet by mouth.    Historical Provider, MD   BP 151/81 mmHg  Pulse 67  Temp(Src) 98.5 F (36.9 C) (Oral)  Resp 17  Ht 4\' 11"  (1.499 m)  Wt 146 lb (66.225 kg)  BMI 29.47 kg/m2  SpO2 99%  LMP 12/06/2015 Physical Exam  Constitutional: She is oriented to person, place, and time. She appears well-developed and well-nourished. No distress.  HENT:  Head: Normocephalic and atraumatic.  Nose: Nose normal.  Mouth/Throat: Oropharynx is clear and moist. No oropharyngeal exudate.  Eyes: Conjunctivae and EOM are normal. Pupils are equal, round, and reactive to light. No scleral icterus.  There is tearing in the bilateral eyes. There is mild erythema in bilateral eyes. There is a small white lesion to the right eye at the 4:00 position.    20/20 vision bilaterally  Neck: Normal range of motion. Neck supple. No JVD present. No tracheal deviation present. No thyromegaly present.  Cardiovascular: Normal rate, regular rhythm and normal heart sounds.  Exam reveals no gallop and no friction rub.   No murmur heard. Pulmonary/Chest: Effort normal and breath sounds normal. No respiratory distress. She has no wheezes. She exhibits no tenderness.  Abdominal: Soft. Bowel sounds are normal. She exhibits no distension and no mass. There is no tenderness. There is no rebound and no guarding.  Musculoskeletal: Normal range of motion. She exhibits no edema or tenderness.  Lymphadenopathy:    She has no cervical  adenopathy.  Neurological: She is alert and oriented to person, place, and time. No cranial nerve deficit. She exhibits normal muscle tone.  Normal strength and sensation in all extremities. Normal cerebellar testing.  Skin: Skin is warm and dry. No rash noted. No erythema. No pallor.  Nursing note and vitals reviewed.   ED Course  Procedures (including critical care time) Labs Review Labs Reviewed - No data to display  Imaging Review No results found. I have personally reviewed and evaluated these images and lab results as part of my medical decision-making.   EKG Interpretation None      MDM   Final diagnoses:  None    Patient persists emergency department for bilateral eye pain. She was given IM Toradol and erythromycin drops. She is advised to see an ophthalmologist for further evaluation. Ocular exam here is normal aside from small lesion in either. I do not believe this represents cataracts however she needs an ophthalmologist evaluation. Neurological exam is completely normal. She appears well in acute distress, vital signs were within her normal limits and she is safe for discharge.    Tomasita CrumbleAdeleke Zetta Stoneman, MD 12/20/15 [id number]

## 2015-12-20 NOTE — Discharge Instructions (Signed)
Erythromycin eye ointment [name], see an ophthalmologist within 3 days for close follow up.  Use eye drops every 8 hours for 5 days. If symptoms worsen, come back to the ED immediately. Thank you.    Kathryn Glenn, consulte a un oftalmlogo dentro de 3 das para un seguimiento cercano. Use gotas para los ojos cada 8 horas durante [address] das. Si los sntomas empeoran, vuelva a la DE inmediatamente. Gracias.   Qu es este medicamento? La ERITROMICINA es un antibitico macrlido. Se utiliza en el tratamiento de ciertas infecciones bacterianas de los ojos. Tambin se utiliza en la prevencin de ciertos tipos de infecciones de los ojos que pueden presentarse en algunos bebs. Este medicamento puede ser utilizado para otros usos; si tiene alguna pregunta consulte con su proveedor de atencin mdica o con su farmacutico. Qu le debo informar a mi profesional de la salud antes de tomar este medicamento? Necesita saber si usted presenta alguno de los siguientes problemas o situaciones: -una reaccin alrgica o inusual a la eritromicina, a alimentos, colorantes o conservantes -si est embarazada o buscando quedar embarazada -si est amamantando a un beb Cmo debo utilizar este medicamento? Este medicamento es para Chemical engineerutilizar WellPointsolamente en los ojos. Siga las instrucciones de la etiqueta del Gonzalezmedicamento. Lvese las manos antes y despus de usarlo. Incline la cabeza levemente hacia atrs y lleve el prpado inferior hacia abajo con su dedo ndice para formar Entergy Corporationuna bolsa. Trate que la punta del tubo no toque el ojo, las yemas de los dedos o cualquier otra superficie. Exprima el extremo del tubo para aplicar una capa fina de la pomada dentro del prpado inferior. Cierre el ojo suavemente para que se esparza la [place]omada. Se le puede nublar la vista por algunos minutos. Utilice sus dosis a intervalos regulares. No utilice sus medicamentos con una frecuencia mayor que la indicada. Complete todo el  tratamiento con el medicamento segn lo haya recetado su mdico o su profesional de la salud, aun si considera que su problema ha mejorado. No interrumpa su uso excepto si as lo indica su mdico o profesional de Beazer Homesla salud. Hable con su pediatra para informarse acerca del uso de este medicamento en nios. Puede requerir atencin especial. Sobredosis: Pngase en contacto inmediatamente con un centro toxicolgico o una sala de urgencia si usted cree que haya tomado demasiado medicamento. ATENCIN: Reynolds AmericanEste medicamento es solo para usted. No comparta este medicamento con nadie. Qu sucede si me olvido de una dosis? Si olvida una dosis, aplquela lo antes posible. Si es casi la hora de la prxima dosis, aplique slo esa dosis. No tome dosis adicionales o dobles. Qu puede interactuar con este medicamento? No se esperan interacciones. No use otros productos para los ojos sin antes consultar con su mdico o su profesional de Radiographer, therapeuticla salud. Puede ser que esta lista no menciona todas las posibles interacciones. Informe a su profesional de Beazer Homesla salud de [employer] Inctodos los productos a base de hierbas, medicamentos de [place]a libre o suplementos nutritivos que est tomando. Si usted fuma, consume bebidas alcohlicas o si utiliza drogas ilegales, indqueselo tambin a su profesional de Beazer Homesla salud. Algunas sustancias pueden interactuar con su medicamento. A qu debo estar atento al usar PPL Corporationeste medicamento? Si los sntomas no mejoran a los 2  3 [address]  [address], consulte a su mdico o a Chiropractorsu profesional de la salud. Qu efectos secundarios puedo tener al Boston Scientificutilizar este medicamento? Efectos secundarios que debe informar a su mdico o a Producer, television/film/videosu profesional de la salud tan pronto como sea  posible: -reacciones alrgicas como erupcin cutnea, picazn o urticarias, hinchazn de la cara, labios o lengua -ardor, escozor o picazn de ojos o prpados -cambios en la visin -enrojecimiento, hinchazn o dolor Puede ser que esta lista no menciona todos los posibles  efectos secundarios. Comunquese a su mdico por asesoramiento mdico Hewlett-Packardsobre los efectos secundarios. Usted puede informar los efectos secundarios a la FDA por telfono al 1-800-FDA-1088. Dnde debo guardar mi medicina? Mantngala fuera del alcance de los nios. Gurdela a Sanmina-SCItemperatura ambiente, entre 15 y 30 grados C (3959 y 2486 grados F). No la congele. Deseche toda pomada sin utilizar despus de su fecha de vencimiento. ATENCIN: Este folleto es un resumen. Puede ser que no cubra toda la posible informacin. Si usted tiene preguntas acerca de esta medicina, consulte con su mdico, su farmacutico o su profesional de Radiographer, therapeuticla salud.    2016, Elsevier/Gold Standard. (2014-08-01 00:00:00)

## 2017-10-30 ENCOUNTER — Encounter (HOSPITAL_COMMUNITY): Payer: Self-pay | Admitting: Emergency Medicine

## 2017-10-30 ENCOUNTER — Emergency Department (HOSPITAL_COMMUNITY)
Admission: EM | Admit: 2017-10-30 | Discharge: 2017-10-30 | Disposition: A | Discharge status: Home / Self Care | Payer: Self-pay | Attending: Emergency Medicine | Admitting: Emergency Medicine

## 2017-10-30 ENCOUNTER — Other Ambulatory Visit: Payer: Self-pay

## 2017-10-30 DIAGNOSIS — M5412 Radiculopathy, cervical region: Secondary | ICD-10-CM | POA: Insufficient documentation

## 2017-10-30 LAB — POC URINE PREG, ED: PREG TEST UR: NEGATIVE

## 2017-10-30 MED ORDER — ACETAMINOPHEN 500 MG PO TABS: 1000.0000 mg | ORAL_TABLET | Freq: Three times a day (TID) | ORAL | 0 refills | Status: AC | PRN

## 2017-10-30 MED ORDER — KETOROLAC TROMETHAMINE 30 MG/ML IJ SOLN
30.0000 mg | Freq: Once | INTRAMUSCULAR | Status: AC
Start: 2017-10-30 — End: 2017-10-30
  Administered 2017-10-30: 30 mg via INTRAMUSCULAR
  Filled 2017-10-30: qty 1

## 2017-10-30 MED ORDER — PREDNISONE 10 MG PO TABS: 40.0000 mg | ORAL_TABLET | Freq: Every day | ORAL | 0 refills | Status: AC

## 2017-10-30 MED ORDER — NAPROXEN 500 MG PO TABS: 500.0000 mg | ORAL_TABLET | Freq: Two times a day (BID) | ORAL | 0 refills | Status: AC

## 2017-10-30 MED ORDER — CYCLOBENZAPRINE HCL 10 MG PO TABS: 10.0000 mg | ORAL_TABLET | Freq: Three times a day (TID) | ORAL | 0 refills | Status: AC

## 2017-10-30 NOTE — Discharge Instructions (Signed)
Given your symptoms and physical exam findings, I suspect you have muscular spasm due to overuse/recent activity or inflammation of nerve in your neck (radiculopathy)   We will treat your inflammation and pain with the following medication regimen: Prednisone 40 mg daily x 5 days Flexeril 500 mg every 8 hours x 5 days Naproxen 500 mg twice a day x 5 days Acetaminophen (tylenol) 1000 mg every 8 hours for the next 5 days Heating pad as needed Over the counter lidocaine patches (salonpas) can be helpful  Avoid any exacerbating activities for the next 48 hours.  After 48 hours, start doing light back range of motion exercises and walking to avoid worsening back stiffness.   Return for fevers, chills, neck stiffness or rigidity, headache, vision changes, vomiting, complete numbness or loss of function of arm, rash  Dados los sntomas y los hallazgos de su examen fsico, sospecho que tiene espasmos musculares debido al uso excesivo / actividad reciente o inflamacin de un nervio en su cuello (radiculopata)  Trataremos su inflamacin y dolor con el siguiente rgimen de medicamentos: -Prednisona 40 mg diarios x 5 das -Flexeril 500 mg cada 8 horas x 5 das. -Naproxeno 500 mg dos veces al da x 5 das -Acetaminofn (tylenol) 1000 mg cada 8 horas durante los prximos 5 das -[place] trmica segn sea necesario -Los parches de Statistician (salonpas) de venta libre pueden ser tiles  Evite las actividades exacerbantes durante las prximas 48 horas. Despus de 48 horas, comience a hacer ejercicios de movimiento leves para empeorar la rigidez del cuello.  Regrese por fiebres, escalofros, rigidez del cuello, dolor de cabeza, cambios en la visin, vmitos, entumecimiento completo o prdida de la funcin del brazo, erupcin cutnea
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
## 2017-10-30 NOTE — ED Triage Notes (Signed)
Patient to ED c/o L sided neck pain x 1 week - reports L arm pain, "like a nerve in my neck is pinched" and intermittent tingling (reports the tingling has been going on for a few months). Pt endorses intermittent blurred vision as well. Denies fevers or neck stiffness, no N/V. Neuro intact, grip strength equal, no focal symptoms noted.

## 2017-10-30 NOTE — ED Provider Notes (Signed)
Patient placed in Quick Look pathway, seen and evaluated   Chief Complaint: Left-sided neck pain, left-sided arm pain  HPI:   *Patient presents today for evaluation of left-sided neck pain for 1 week and left-sided arm pain.  She reports that these are intermittent.  She occasionally feels tingling in her left arm.  She occasionally has blurred vision, no fevers, neck stiffness, nausea vomiting.  No Recent trauma.  ROS:    Physical Exam:   Gen: No distress  Neuro: Awake and Alert  Skin: Warm    Focused Exam:5/5 grip strength bilaterally.   Initiation of care has begun. The patient has been counseled on the process, plan, and necessity for staying for the completion/evaluation, and the remainder of the medical screening examination    Norman Clay 10/30/17 [id number]    Tegeler, Canary Brim, MD 10/31/17 [phone]

## 2019-04-16 ENCOUNTER — Other Ambulatory Visit: Payer: Self-pay

## 2019-04-16 ENCOUNTER — Emergency Department (HOSPITAL_COMMUNITY)
Admission: EM | Admit: 2019-04-16 | Discharge: 2019-04-17 | Disposition: A | Discharge status: Home / Self Care | Payer: Self-pay | Attending: Emergency Medicine | Admitting: Emergency Medicine

## 2019-04-16 ENCOUNTER — Emergency Department (HOSPITAL_COMMUNITY): Payer: Self-pay

## 2019-04-16 ENCOUNTER — Encounter (HOSPITAL_COMMUNITY): Payer: Self-pay | Admitting: Emergency Medicine

## 2019-04-16 DIAGNOSIS — R0602 Shortness of breath: Secondary | ICD-10-CM | POA: Insufficient documentation

## 2019-04-16 DIAGNOSIS — R42 Dizziness and giddiness: Secondary | ICD-10-CM | POA: Insufficient documentation

## 2019-04-16 DIAGNOSIS — R1110 Vomiting, unspecified: Secondary | ICD-10-CM | POA: Insufficient documentation

## 2019-04-16 DIAGNOSIS — Z5321 Procedure and treatment not carried out due to patient leaving prior to being seen by health care provider: Secondary | ICD-10-CM | POA: Insufficient documentation

## 2019-04-16 LAB — BASIC METABOLIC PANEL
Anion gap: 12 (ref 5–15)
BUN: 8 mg/dL (ref 6–20)
CO2: 22 mmol/L (ref 22–32)
Calcium: 8.9 mg/dL (ref 8.9–10.3)
Chloride: 104 mmol/L (ref 98–111)
Creatinine, Ser: 0.48 mg/dL (ref 0.44–1.00)
GFR calc Af Amer: >60 mL/min (ref >60)
GFR calc non Af Amer: >60 mL/min (ref >60)
Glucose, Bld: 126 mg/dL — ABNORMAL HIGH (ref 70–99)
Potassium: 3.4 mmol/L — ABNORMAL LOW (ref 3.5–5.1)
Sodium: 138 mmol/L (ref 135–145)

## 2019-04-16 LAB — CBC
HCT: 40.1 % (ref 36.0–46.0)
Hemoglobin: 13.3 g/dL (ref 12.0–15.0)
MCH: 29.2 pg (ref 26.0–34.0)
MCHC: 33.2 g/dL (ref 30.0–36.0)
MCV: 87.9 fL (ref 80.0–100.0)
Platelets: 226 10*3/uL (ref 150–400)
RBC: 4.56 MIL/uL (ref 3.87–5.11)
RDW: 13.2 % (ref 11.5–15.5)
WBC: 6.1 10*3/uL (ref 4.0–10.5)
nRBC: 0 % (ref 0.0–0.2)

## 2019-04-16 LAB — I-STAT BETA HCG BLOOD, ED (MC, WL, AP ONLY): I-stat hCG, quantitative: <5 m[IU]/mL (ref <5)

## 2019-04-16 LAB — TROPONIN I (HIGH SENSITIVITY)
Troponin I (High Sensitivity): 3 ng/L (ref <18)
Troponin I (High Sensitivity): 2 ng/L (ref <18)

## 2019-04-16 MED ORDER — SODIUM CHLORIDE 0.9% FLUSH: 3.0000 mL | Freq: Once | INTRAVENOUS | Status: DC

## 2019-04-16 NOTE — ED Triage Notes (Signed)
C/o headache, vomiting, and dizziness since last Friday.  States she had bilateral eye pain that resolved.  Also reports heart beating fast and SOB.

## 2019-04-17 NOTE — ED Notes (Signed)
Advised patient to stay, patient decided to leave anyway.

## 2019-09-01 ENCOUNTER — Other Ambulatory Visit: Payer: Self-pay

## 2019-09-01 ENCOUNTER — Inpatient Hospital Stay (HOSPITAL_COMMUNITY)
Admission: EM | Admit: 2019-09-01 | Discharge: 2019-09-02 | Disposition: A | Discharge status: Home / Self Care | Payer: Self-pay | Attending: Emergency Medicine | Admitting: Emergency Medicine

## 2019-09-01 ENCOUNTER — Encounter (HOSPITAL_COMMUNITY): Payer: Self-pay

## 2019-09-01 DIAGNOSIS — O209 Hemorrhage in early pregnancy, unspecified: Secondary | ICD-10-CM

## 2019-09-01 DIAGNOSIS — Z3A01 Less than 8 weeks gestation of pregnancy: Secondary | ICD-10-CM | POA: Insufficient documentation

## 2019-09-01 DIAGNOSIS — O200 Threatened abortion: Secondary | ICD-10-CM | POA: Insufficient documentation

## 2019-09-01 DIAGNOSIS — R102 Pelvic and perineal pain: Secondary | ICD-10-CM

## 2019-09-01 DIAGNOSIS — O3680X0 Pregnancy with inconclusive fetal viability, not applicable or unspecified: Secondary | ICD-10-CM

## 2019-09-01 DIAGNOSIS — R109 Unspecified abdominal pain: Secondary | ICD-10-CM | POA: Insufficient documentation

## 2019-09-01 DIAGNOSIS — O26899 Other specified pregnancy related conditions, unspecified trimester: Secondary | ICD-10-CM

## 2019-09-01 LAB — I-STAT BETA HCG BLOOD, ED (MC, WL, AP ONLY): I-stat hCG, quantitative: >2000 m[IU]/mL — ABNORMAL HIGH (ref <5)

## 2019-09-01 NOTE — MAU Provider Note (Signed)
Chief Complaint: Abdominal Pain   First Provider Initiated Contact with Patient 09/01/19 2319        SUBJECTIVE HPI: Kathryn Glenn is a 39 y.o. [id number] at Unknown by LMP who presents to maternity admissions reporting vaginal bleeding for the past 4 days.  Also has some lower abdominal pain.  Has not had any evaluation for this pregnancy yet. She denies vaginal itching/burning, urinary symptoms, h/a, dizziness, n/v, or fever/chills.    Abdominal Pain This is a new problem. The current episode started in the past 7 days. The problem occurs intermittently. The problem has been unchanged. The pain is located in the LLQ, RLQ and suprapubic region. The quality of the pain is cramping. The abdominal pain does not radiate. Pertinent negatives include no constipation, diarrhea, dysuria, fever, frequency, myalgias, nausea or vomiting. Nothing aggravates the pain. The pain is relieved by nothing. She has tried nothing for the symptoms.  Vaginal Bleeding The patient's primary symptoms include pelvic pain and vaginal bleeding. The patient's pertinent negatives include no genital itching, genital lesions or genital odor. This is a new problem. The current episode started in the past 7 days. The problem occurs constantly. The problem has been unchanged. She is pregnant. Associated symptoms include abdominal pain. Pertinent negatives include no back pain, constipation, diarrhea, dysuria, fever, frequency, nausea or vomiting. The vaginal discharge was bloody. The vaginal bleeding is lighter than menses. She has not been passing clots. She has not been passing tissue. Nothing aggravates the symptoms. She has tried nothing for the symptoms.   RN Note: Pt here with complaints of vaginal bleeding for the last 4 days. Pt also reports intermittent mid/left abdominal pain that she rates 5/10. She describes pain as achy. Has not tried anything for pain. She denies any urinary s/s. LMP: unsure but has not had period  for 2 months.   Past Medical History:  Diagnosis Date  . Medical history non-contributory   . No pertinent past medical history   . Preterm labor    Past Surgical History:  Procedure Laterality Date  . APPENDECTOMY    . CESAREAN SECTION     C/S x 1  . CESAREAN SECTION N/A 08/10/2012   Procedure: CESAREAN SECTION;  Surgeon: Mora Bellman, MD;  Location: [facility];  Service: Obstetrics;  Laterality: N/A;  Repeat   Social History   Socioeconomic History  . Marital status: Married    Spouse name: Not on file  . Number of children: Not on file  . Years of education: Not on file  . Highest education level: Not on file  Occupational History  . Not on file  Tobacco Use  . Smoking status: Never Smoker  . Smokeless tobacco: Never Used  Substance and Sexual Activity  . Alcohol use: No  . Drug use: No  . Sexual activity: Yes    Birth control/protection: None  Other Topics Concern  . Not on file  Social History Narrative  . Not on file   Social Determinants of Health   Financial Resource Strain:   . Difficulty of Paying Living Expenses:   Food Insecurity:   . Worried About Charity fundraiser in the Last Year:   . Arboriculturist in the Last Year:   Transportation Needs:   . Film/video editor (Medical):   Marland Kitchen Lack of Transportation (Non-Medical):   Physical Activity:   . Days of Exercise per Week:   . Minutes of Exercise per Session:   Stress:   .  Feeling of Stress :   Social Connections:   . Frequency of Communication with Friends and Family:   . Frequency of Social Gatherings with Friends and Family:   . Attends Religious Services:   . Active Member of Clubs or Organizations:   . Attends Banker Meetings:   Marland Kitchen Marital Status:   Intimate Partner Violence:   . Fear of Current or Ex-Partner:   . Emotionally Abused:   Marland Kitchen Physically Abused:   . Sexually Abused:    Current Facility-Administered Medications on File Prior to Encounter  Medication Dose Route  Frequency Provider Last Rate Last Admin  . TDaP (BOOSTRIX) injection 0.5 mL  0.5 mL Intramuscular Once Amedeo Gory, CNM       Current Outpatient Medications on File Prior to Encounter  Medication Sig Dispense Refill  . docusate sodium 100 MG CAPS Take 100 mg by mouth 2 (two) times daily as needed. 30 capsule 1  . ibuprofen (ADVIL,MOTRIN) 600 MG tablet Take 1 tablet (600 mg total) by mouth every 6 (six) hours. 60 tablet 5  . oxyCODONE-acetaminophen (PERCOCET/ROXICET) 5-325 MG per tablet Take 1-2 tablets by mouth every 6 (six) hours as needed for pain. 60 tablet 0  . Prenatal Vit-Fe Fumarate-FA (PRENATAL VITAMINS) 28-0.8 MG TABS Take 1 tablet by mouth.     No Known Allergies  I have reviewed patient's Past Medical Hx, Surgical Hx, Family Hx, Social Hx, medications and allergies.   ROS:  Review of Systems  Constitutional: Negative for fever.  Gastrointestinal: Positive for abdominal pain. Negative for constipation, diarrhea, nausea and vomiting.  Genitourinary: Positive for pelvic pain and vaginal bleeding. Negative for dysuria and frequency.  Musculoskeletal: Negative for back pain and myalgias.   Review of Systems  Other systems negative   Physical Exam  Physical Exam Patient Vitals for the past 24 hrs:  BP Temp Temp src Pulse Resp SpO2 Height Weight  09/01/19 2318 -- -- -- -- -- -- 4\' 9"  (1.448 m) 66.5 kg  09/01/19 2209 (!) 158/93 98.6 F (37 C) Oral 80 18 100 % -- --   Vitals:   09/01/19 2209 09/01/19 2318 09/01/19 2326  BP: (!) 158/93  136/79  Pulse: 80  72  Resp: 18  18  Temp: 98.6 F (37 C)    TempSrc: Oral  Oral  SpO2: 100%  100%  Weight:  66.5 kg   Height:  4\' 9"  (1.448 m)     Constitutional: Well-developed, well-nourished female in no acute distress.  Cardiovascular: normal rate Respiratory: normal effort GI: Abd soft, non-tender. Pos BS x 4 MS: Extremities nontender, no edema, normal ROM Neurologic: Alert and oriented x 4.  GU: Neg  CVAT.  PELVIC EXAM: Cervix pink, visually closed, without lesion, small amount of bloody discharge, vaginal walls and external genitalia normal Bimanual exam: Cervix 0/long/high, firm, anterior, neg CMT, uterus tender, nonenlarged, adnexa with bilateral  Tenderness but no enlargement, or mass   LAB RESULTS Results for orders placed or performed during the hospital encounter of 09/01/19 (from the past 24 hour(s))  I-Stat beta hCG blood, ED     Status: Abnormal   Collection Time: 09/01/19 10:26 PM  Result Value Ref Range   I-stat hCG, quantitative >2,000.0 (H) <5 mIU/mL   Comment 3          Urinalysis, Routine w reflex microscopic     Status: Abnormal   Collection Time: 09/01/19 11:37 PM  Result Value Ref Range   Color, Urine YELLOW YELLOW  APPearance HAZY (A) CLEAR   Specific Gravity, Urine 1.012 1.005 - 1.030   pH 7.0 5.0 - 8.0   Glucose, UA NEGATIVE NEGATIVE mg/dL   Hgb urine dipstick NEGATIVE NEGATIVE   Bilirubin Urine NEGATIVE NEGATIVE   Ketones, ur NEGATIVE NEGATIVE mg/dL   Protein, ur NEGATIVE NEGATIVE mg/dL   Nitrite NEGATIVE NEGATIVE   Leukocytes,Ua NEGATIVE NEGATIVE  Wet prep, genital     Status: Abnormal   Collection Time: 09/01/19 11:43 PM   Specimen: Vaginal  Result Value Ref Range   Yeast Wet Prep HPF POC NONE SEEN NONE SEEN   Trich, Wet Prep NONE SEEN NONE SEEN   Clue Cells Wet Prep HPF POC NONE SEEN NONE SEEN   WBC, Wet Prep HPF POC MODERATE (A) NONE SEEN   Sperm NONE SEEN   CBC     Status: None   Collection Time: 09/02/19 12:03 AM  Result Value Ref Range   WBC 7.3 4.0 - 10.5 K/uL   RBC 4.51 3.87 - 5.11 MIL/uL   Hemoglobin 12.8 12.0 - 15.0 g/dL   HCT 62.0 35.5 - 97.4 %   MCV 85.1 80.0 - 100.0 fL   MCH 28.4 26.0 - 34.0 pg   MCHC 33.3 30.0 - 36.0 g/dL   RDW 16.3 84.5 - 36.4 %   Platelets 240 150 - 400 K/uL   nRBC 0.0 0.0 - 0.2 %  hCG, quantitative, pregnancy     Status: Abnormal   Collection Time: 09/02/19 12:03 AM  Result Value Ref Range   hCG, Beta  Chain, Quant, S 8,478 (H) <5 mIU/mL  HIV Antibody (routine testing w rflx)     Status: None   Collection Time: 09/02/19 12:03 AM  Result Value Ref Range   HIV Screen 4th Generation wRfx NON REACTIVE NON REACTIVE   IMAGING US OB Comp Less 14 Wks  Result Date: 09/02/2019 CLINICAL DATA:  Initial evaluation for acute vaginal bleeding, pelvic pain. Early pregnancy. EXAM: OBSTETRIC <14 WK [place] AND TRANSVAGINAL OB US TECHNIQUE: Both transabdominal and transvaginal ultrasound examinations were performed for complete evaluation of the gestation as well as the maternal uterus, adnexal regions, and pelvic cul-de-sac. Transvaginal technique was performed to assess early pregnancy. COMPARISON:  None available. FINDINGS: Intrauterine gestational sac: Single Yolk sac:  Present Embryo:  Present Cardiac Activity: Negative. Heart Rate: N/A  bpm CRL: 5.72 mm   6 w   2 d                  [place] EDC: 04/25/2020 Subchorionic hemorrhage:  None visualized. Maternal uterus/adnexae: Ovaries are within normal limits bilaterally. Corpus luteal cyst noted on the right. No free fluid within the pelvis. IMPRESSION: 1. Single IUP with internal yolk sac and embryo, crown-rump length measuring 5.72 mm, but no detectable cardiac activity. Findings are suspicious but not yet definitive for failed pregnancy. Recommend follow-up US in 10-14 days for definitive diagnosis. This recommendation follows SRU consensus guidelines: Diagnostic Criteria for Nonviable Pregnancy Early in the First Trimester. Malva Limes Med 2013; 680:3212-24. 2. No other acute maternal uterine or adnexal abnormality identified. Electronically Signed   By: Rise Mu M.D.   On: 09/02/2019 01:57   US OB Transvaginal  Result Date: 09/02/2019 CLINICAL DATA:  Initial evaluation for acute vaginal bleeding, pelvic pain. Early pregnancy. EXAM: OBSTETRIC <14 WK [place] AND TRANSVAGINAL OB US TECHNIQUE: Both transabdominal and transvaginal ultrasound examinations were performed for  complete evaluation of the gestation as well as the maternal uterus, adnexal regions, and  pelvic cul-de-sac. Transvaginal technique was performed to assess early pregnancy. COMPARISON:  None available. FINDINGS: Intrauterine gestational sac: Single Yolk sac:  Present Embryo:  Present Cardiac Activity: Negative. Heart Rate: N/A  bpm CRL: 5.72 mm   6 w   2 d                  [place] EDC: 04/25/2020 Subchorionic hemorrhage:  None visualized. Maternal uterus/adnexae: Ovaries are within normal limits bilaterally. Corpus luteal cyst noted on the right. No free fluid within the pelvis. IMPRESSION: 1. Single IUP with internal yolk sac and embryo, crown-rump length measuring 5.72 mm, but no detectable cardiac activity. Findings are suspicious but not yet definitive for failed pregnancy. Recommend follow-up US in 10-14 days for definitive diagnosis. This recommendation follows SRU consensus guidelines: Diagnostic Criteria for Nonviable Pregnancy Early in the First Trimester. Malva Limes Med 2013; 935:7017-79. 2. No other acute maternal uterine or adnexal abnormality identified. Electronically Signed   By: Rise Mu M.D.   On: 09/02/2019 01:57     MAU Management/MDM: Ordered usual first trimester r/o ectopic labs.   Pelvic exam and cultures done Will check baseline Ultrasound to rule out ectopic.  This bleeding/pain can represent a normal pregnancy with bleeding, spontaneous abortion or even an ectopic which can be life-threatening.  The process as listed above helps to determine which of these is present.  Reviewed results Will schedule followup US next week with followup in clinic  ASSESSMENT 1. Abdominal pain during pregnancy, antepartum   2.    Bleeding in first trimester 3.     Pregnancy unknown orignin, confirmed not ectopic 4.     Threatened abortion   PLAN Discharge home Plan to repeat Ultrasound in about 7-10 days SAB precautions  Pt stable at time of discharge. Encouraged to return here  or to other Urgent Care/ED if she develops worsening of symptoms, increase in pain, fever, or other concerning symptoms.    Wynelle Bourgeois CNM, MSN Certified Nurse-Midwife 09/01/2019  11:19 PM

## 2019-09-01 NOTE — ED Triage Notes (Addendum)
Pt arrives via POV from home w/ c/o 5/10 abdominal pain and vaginal bleeding x 4 days. Pt states she is 2 months pregnant. Pt endorses nausea, denies vomiting, diarrhea. Pt requires spanish interpreter.

## 2019-09-01 NOTE — MAU Note (Signed)
Pt here with complaints of vaginal bleeding for the last 4 days. Pt also reports intermittent mid/left abdominal pain that she rates 5/10. She describes pain as achy. Has not tried anything for pain. She denies any urinary s/s. LMP: unsure but has not had period for 2 months.

## 2019-09-01 NOTE — ED Provider Notes (Signed)
MSE was initiated and I personally evaluated the patient and placed orders (if any) at  10:37 PM on September 01, 2019.  The patient appears stable so that the remainder of the MSE may be completed by another provider.  Kathryn Glenn is a 39 y.o. female G34P4 @ approx 2 months pregnant without prenatal care presents with abdominal pain and blood is only with urination. Pt does report painful urination. Denies fever and chills.    BP (!) 158/93 (BP Location: Right Arm)   Pulse 80   Temp 98.6 F (37 C) (Oral)   Resp 18   SpO2 100%   Face to face Exam:   General: Awake  HEENT: Atraumatic  Resp: Normal effort  Abd: Nondistended, soft  Neuro:No focal weakness   10:43 PM Discussed with MAU APP who will accept in transfer.   Abdominal pain during pregnancy, antepartum      [name], Boyd Kerbs 09/01/19 2300    Melene Plan, DO 09/01/19 2306

## 2019-09-01 NOTE — ED Notes (Signed)
No vaginal bleeding, pain with urination and blood after she wipes.

## 2019-09-02 ENCOUNTER — Encounter (HOSPITAL_COMMUNITY): Payer: Self-pay | Admitting: Family Medicine

## 2019-09-02 ENCOUNTER — Inpatient Hospital Stay (HOSPITAL_COMMUNITY): Payer: Self-pay

## 2019-09-02 DIAGNOSIS — Z3A01 Less than 8 weeks gestation of pregnancy: Secondary | ICD-10-CM

## 2019-09-02 DIAGNOSIS — O200 Threatened abortion: Secondary | ICD-10-CM

## 2019-09-02 LAB — HIV ANTIBODY (ROUTINE TESTING W REFLEX): HIV Screen 4th Generation wRfx: NONREACTIVE

## 2019-09-02 LAB — CBC
HCT: 38.4 % (ref 36.0–46.0)
Hemoglobin: 12.8 g/dL (ref 12.0–15.0)
MCH: 28.4 pg (ref 26.0–34.0)
MCHC: 33.3 g/dL (ref 30.0–36.0)
MCV: 85.1 fL (ref 80.0–100.0)
Platelets: 240 10*3/uL (ref 150–400)
RBC: 4.51 MIL/uL (ref 3.87–5.11)
RDW: 14.2 % (ref 11.5–15.5)
WBC: 7.3 10*3/uL (ref 4.0–10.5)
nRBC: 0 % (ref 0.0–0.2)

## 2019-09-02 LAB — URINALYSIS, ROUTINE W REFLEX MICROSCOPIC
Bilirubin Urine: NEGATIVE
Glucose, UA: NEGATIVE mg/dL
Hgb urine dipstick: NEGATIVE
Ketones, ur: NEGATIVE mg/dL
Leukocytes,Ua: NEGATIVE
Nitrite: NEGATIVE
Protein, ur: NEGATIVE mg/dL
Specific Gravity, Urine: 1.012 (ref 1.005–1.030)
pH: 7 (ref 5.0–8.0)

## 2019-09-02 LAB — WET PREP, GENITAL
Clue Cells Wet Prep HPF POC: NONE SEEN
Sperm: NONE SEEN
Trich, Wet Prep: NONE SEEN
Yeast Wet Prep HPF POC: NONE SEEN

## 2019-09-02 LAB — HCG, QUANTITATIVE, PREGNANCY: hCG, Beta Chain, Quant, S: 8478 m[IU]/mL — ABNORMAL HIGH (ref <5)

## 2019-09-02 NOTE — Discharge Instructions (Signed)
Dolor abdominal durante el embarazo Abdominal Pain During Pregnancy  El dolor abdominal es comn durante el embarazo y tiene muchas causas posibles. Algunas causas son ms graves que otras, y a Advertising account executive causa se desconoce. El dolor abdominal puede ser un indicio de que est comenzando el [place]. Tambin puede ser ocasionado por el crecimiento y estiramiento de los msculos y ligamentos durante el Psychiatrist. Siempre informe a su mdico si siente dolor abdominal. Siga estas indicaciones en su casa:  No tenga relaciones sexuales ni se coloque nada dentro de la vagina hasta que el dolor haya desaparecido completamente.  Descanse todo lo que pueda [employer] dolor se le haya calmado.  Beba suficiente lquido para Photographer orina de color amarillo plido.  Tome los medicamentos de venta libre y los recetados solamente como se lo haya indicado el mdico.  Oceanographer a todas las visitas de control como se lo haya indicado el mdico. Esto es importante. Comunquese con un mdico si:  El dolor contina o empeora despus de Lawyer.  Siente dolor en la parte inferior del abdomen que: ? Va y viene en intervalos regulares. ? Se extiende a la espalda. ? Es parecido a los Tree surgeon.  Siente dolor o ardor al Geographical information systems officer. Solicite ayuda de inmediato si:  Tiene fiebre o siente escalofros.  Tiene una hemorragia vaginal abundante.  Tiene una prdida de lquido por la vagina.  Elimina tejidos por la vagina.  Vomita o tiene diarrea durante ms de 24horas.  El beb se mueve menos de lo habitual.  Se siente dbil o se desmaya.  Le falta el aire.  Siente dolor intenso en la parte superior del abdomen. Resumen  El dolor abdominal es comn durante el [place] y tiene muchas causas posibles.  Si siente dolor abdominal durante el embarazo, informe al mdico de inmediato.  Siga las indicaciones del mdico para el cuidado en el hogar y concurra a todas las visitas de control como se lo  hayan indicado. Esta informacin no tiene Theme park manager el consejo del mdico. Asegrese de hacerle al mdico cualquier pregunta que tenga. Document Revised: 12/02/2016 Document Reviewed: 12/02/2016 Elsevier Patient Education  2020 [employer].  Primer trimestre de Psychiatrist First Trimester of Pregnancy  El primer trimestre de Psychiatrist se extiende desde la semana1 hasta el final de la semana13 (mes1 al mes3). Durante este tiempo, el beb comenzar a desarrollarse dentro suyo. Entre la semana6 y [place], se forman los ojos y Recruitment consultant, y los latidos del corazn pueden escucharse en la ecografa. Al final de las 12semanas, todos los rganos del beb estn formados. El cuidado prenatal es toda la asistencia mdica que usted recibe antes del nacimiento del beb. Asegrese de recibir un buen cuidado prenatal y de seguir todas las indicaciones del mdico. Siga estas indicaciones en su casa: Medicamentos  Tome los medicamentos de venta libre y los recetados solamente como se lo haya indicado el mdico. Algunos medicamentos son seguros para tomar durante el Psychiatrist y otros no lo son.  Tome vitaminas prenatales que contengan por lo menos (?g) de cido flico.  Si tiene problemas para defecar (estreimiento), tome un medicamento que ablanda la materia fecal (laxante), siempre que lo autorice el mdico. Comida y bebida   Ingiera alimentos saludables de [place] regular.  El Firefighter la cantidad de peso que [place].  No coma carne cruda ni quesos sin cocinar.  Si tiene Programme researcher, broadcasting/film/video (nuseas) o vomita (vmitos): ? Ingiera 4 o 5comidas pequeas  por Geophysical data processor de 3abundantes. ? Intente comer algunas galletitas saladas. ? Beba lquidos [employer], en lugar de [employer].  Para evitar el estreimiento: ? Consuma alimentos ricos en fibra, como frutas y verduras frescas, cereales integrales y legumbres. ? Beba suficiente lquido para  mantener el pis (orina) claro o de color amarillo plido. Actividad  Haga ejercicios solamente como se lo haya indicado el mdico. Deje de hacer ejercicios si tiene clicos o dolor en la parte baja del vientre (abdomen) o en la cintura.  No haga actividad fsica si el clima est demasiado caluroso o hmedo, o si se encuentra en un lugar muy alto (altitud elevada).  Intente no estar de pie FedEx. Mueva las piernas con frecuencia si debe estar de pie en un lugar durante mucho tiempo.  Evite levantar pesos [employer].  Use zapatos con tacones bajos. Mantenga una buena postura al sentarse y pararse.  Puede tener [employer] [place] [employer], a menos que el mdico le indique lo contrario. Alivio del dolor y del Dentist  Use un sostn que le brinde buen soporte si le duelen las [place].  Dese baos de asiento con agua tibia para Engineer, materials o las molestias causadas por las hemorroides. Use una crema antihemorroidal si el mdico se lo permite.  Descanse con las piernas elevadas si tiene calambres o dolor de cintura.  Si tiene las venas de las piernas hinchadas y abultadas (venas varicosas): ? Use medias elsticas de soporte o medias de compresin como se lo haya indicado el mdico. ? Levante (eleve) los pies durante , 3 o 4veces por Futures trader. ? Limite la sal en sus alimentos. Cuidado prenatal  Programe las visitas prenatales para la semana12 de Road Runner.  Escriba sus preguntas. Llvelas cuando concurra a las visitas prenatales.  Concurra a todas las visitas prenatales como se lo haya indicado el mdico. Esto es importante. Seguridad  Use el cinturn de seguridad en todo momento mientras conduce.  Haga una lista con los nmeros de telfono en caso de Associate Professor. Esta lista debe incluir los nmeros de los familiares, los amigos, el hospital y los departamentos de polica y de bomberos. Instrucciones generales  Pdale al mdico que la derive a clases prenatales en su  localidad. Debe comenzar a tomar las clases antes de Cytogeneticist en el mes6 de embarazo.  Pida ayuda si necesita asesoramiento o asistencia con la alimentacin. El mdico puede aconsejarla o indicarle dnde recurrir para recibir [place].  No se d baos de inmersin en agua caliente, baos turcos ni saunas.  No se haga duchas vaginales ni use tampones o toallas higinicas perfumadas.  No mantenga las piernas cruzadas durante [place].  Evite las hierbas y el alcohol. Evite los frmacos que el mdico no haya autorizado.  No consuma ningn producto que contenga tabaco, lo que incluye cigarrillos, tabaco de Theatre manager o Administrator, Civil Service. Si necesita ayuda para dejar de fumar, consulte al [employer] Express. Puede recibir asesoramiento u otro tipo de apoyo para dejar de fumar.  Evite el contacto con las bandejas sanitarias de los gatos y la tierra que estos animales usan. Estos elementos contienen grmenes que pueden causar defectos congnitos al beb y la posible prdida del feto (aborto espontneo) o muerte fetal.  Visite al dentista. En su casa, lvese los dientes con un cepillo dental suave. Psese el hilo dental con suavidad. Comunquese con un mdico si:  Tiene mareos.  Tiene clicos leves o siente presin en la parte baja del vientre.  Sufre un  dolor persistente en el abdomen.  Sigue teniendo [employer], vomita o la materia fecal es lquida (diarrea).  Nota una secrecin de lquido con olor ftido que proviene de la vagina.  Tiene dolor al hacer pis (orinar).  Tiene el rostro, las [place], las piernas o los tobillos ms hinchados (inflamados). Solicite ayuda de inmediato si:  Tiene fiebre.  Tiene una prdida de lquido por la vagina.  Tiene sangrado o pequeas prdidas vaginales.  Tiene clicos o dolor muy intensos en el vientre.  Sube o baja de peso rpidamente.  Vomita sangre. Esto tiene Chiropodist similar a la borra del caf.  Est en contacto con personas que tienen  rubola, la quinta enfermedad o varicela.  Siente un dolor de cabeza muy intenso.  Le falta el aire.  Sufre cualquier tipo de traumatismo, por ejemplo, debido a una cada o un accidente automovilstico. Resumen  El primer trimestre de Media planner se extiende desde la semana1 hasta el final de la semana13 (mes1 al mes3).  Para cuidar su salud y la del beb en gestacin, necesitar consumir alimentos saludables, tomar medicamentos solamente si lo autoriza el mdico, y Field seismologist actividades que sean seguras para usted y para su beb.  Concurra a todas las visitas de control como se lo haya indicado el mdico. Esto es importante porque el mdico deber asegurar que el beb est saludable y est creciendo bien. Esta informacin no tiene Marine scientist el consejo del mdico. Asegrese de hacerle al mdico cualquier pregunta que tenga. Document Revised: 01/13/2017 Document Reviewed: 01/13/2017 Elsevier Patient Education  [place].

## 2019-09-03 ENCOUNTER — Other Ambulatory Visit: Payer: Self-pay

## 2019-09-03 ENCOUNTER — Inpatient Hospital Stay (HOSPITAL_COMMUNITY)
Admission: AD | Admit: 2019-09-03 | Discharge: 2019-09-04 | Disposition: A | Discharge status: Home / Self Care | Payer: Self-pay | Attending: Obstetrics and Gynecology | Admitting: Obstetrics and Gynecology

## 2019-09-03 DIAGNOSIS — R109 Unspecified abdominal pain: Secondary | ICD-10-CM | POA: Insufficient documentation

## 2019-09-03 DIAGNOSIS — O034 Incomplete spontaneous abortion without complication: Secondary | ICD-10-CM | POA: Insufficient documentation

## 2019-09-03 DIAGNOSIS — Z3A01 Less than 8 weeks gestation of pregnancy: Secondary | ICD-10-CM | POA: Insufficient documentation

## 2019-09-03 DIAGNOSIS — O209 Hemorrhage in early pregnancy, unspecified: Secondary | ICD-10-CM

## 2019-09-03 NOTE — ED Provider Notes (Signed)
MSE was initiated and I personally evaluated the patient and placed orders (if any) at  10:15 PM on September 03, 2019.  The patient appears stable so that the remainder of the MSE may be completed by another provider.   Kathryn Glenn is a 39 y.o. female [phone] presents with vaginal bleeding onset 6 days ago with acute increase in bleeding and pain in the last 24 hours.  Pt evaluated 2 days ago for similar but less intense symptoms.  Pregnancy of unknown origin at that time.  No aggravating or alleviating factors.    Face to face Exam:   General: Awake  HEENT: Atraumatic  Resp: Normal effort  Abd: Nondistended, tender in the lower abd  BP (!) 150/92 (BP Location: Right Arm)   Pulse 88   Temp 98.3 F (36.8 C) (Oral)   Resp 16   Wt 66.2 kg   SpO2 100%   BMI 31.59 kg/m   10:21 PM Discussed with Misty Stanley, MAU APP who will accept in transfer.  Vaginal bleeding affecting early pregnancy    Illyanna Petillo, Boyd Kerbs 09/03/19 2228    Sabas Sous, MD 09/04/19 [phone]

## 2019-09-03 NOTE — MAU Note (Signed)
Per Video Interpreter, pt. states that lower abdominal pain and vaginal bleeding started 4 days ago. She came in today because the bleeding is worse and the pain is "unbearable".

## 2019-09-04 ENCOUNTER — Inpatient Hospital Stay (HOSPITAL_COMMUNITY): Payer: Self-pay

## 2019-09-04 ENCOUNTER — Encounter (HOSPITAL_COMMUNITY): Payer: Self-pay | Admitting: Obstetrics and Gynecology

## 2019-09-04 DIAGNOSIS — O034 Incomplete spontaneous abortion without complication: Secondary | ICD-10-CM

## 2019-09-04 LAB — CBC
HCT: 37.3 % (ref 36.0–46.0)
Hemoglobin: 12.8 g/dL (ref 12.0–15.0)
MCH: 29.2 pg (ref 26.0–34.0)
MCHC: 34.3 g/dL (ref 30.0–36.0)
MCV: 85 fL (ref 80.0–100.0)
Platelets: 223 10*3/uL (ref 150–400)
RBC: 4.39 MIL/uL (ref 3.87–5.11)
RDW: 14.1 % (ref 11.5–15.5)
WBC: 9.2 10*3/uL (ref 4.0–10.5)
nRBC: 0 % (ref 0.0–0.2)

## 2019-09-04 LAB — HCG, QUANTITATIVE, PREGNANCY: hCG, Beta Chain, Quant, S: 5816 m[IU]/mL — ABNORMAL HIGH (ref <5)

## 2019-09-04 MED ORDER — HYDROMORPHONE HCL 1 MG/ML IJ SOLN
1.0000 mg | Freq: Once | INTRAMUSCULAR | Status: AC
  Administered 2019-09-04: 1 mg via INTRAMUSCULAR
  Filled 2019-09-04: qty 1

## 2019-09-04 MED ORDER — MISOPROSTOL 200 MCG PO TABS
600.0000 ug | ORAL_TABLET | Freq: Once | ORAL | Status: AC
  Administered 2019-09-04: 600 ug via ORAL
  Filled 2019-09-04: qty 3

## 2019-09-04 MED ORDER — TRAMADOL HCL 50 MG PO TABS: 50.0000 mg | ORAL_TABLET | Freq: Four times a day (QID) | ORAL | 0 refills | Status: DC | PRN

## 2019-09-04 NOTE — MAU Provider Note (Signed)
Chief Complaint: Abdominal Pain   First Provider Initiated Contact with Patient 09/04/19 0058      SUBJECTIVE HPI: Kathryn Glenn is a 39 y.o. [id number] at [redacted]w[redacted]d by [place] who presents to maternity admissions reporting onset of severe pain and heavy bleeding today.  She started bleeding 4 days ago, but bleeding become much heavier with large clots at home today and was associated with severe intermittent cramping abdominal pain. The pain radiated to her low back and was worsening over time.  There are no other symptoms.  She has not tried any treatments.     HPI  Past Medical History:  Diagnosis Date  . Medical history non-contributory   . No pertinent past medical history   . Preterm labor    Past Surgical History:  Procedure Laterality Date  . APPENDECTOMY    . CESAREAN SECTION     C/S x 1  . CESAREAN SECTION N/A 08/10/2012   Procedure: CESAREAN SECTION;  Surgeon: Catalina Antigua, MD;  Location: [facility];  Service: Obstetrics;  Laterality: N/A;  Repeat   Social History   Socioeconomic History  . Marital status: Single    Spouse name: Not on file  . Number of children: Not on file  . Years of education: Not on file  . Highest education level: Not on file  Occupational History  . Not on file  Tobacco Use  . Smoking status: Never Smoker  . Smokeless tobacco: Never Used  Substance and Sexual Activity  . Alcohol use: No  . Drug use: No  . Sexual activity: Yes    Birth control/protection: None  Other Topics Concern  . Not on file  Social History Narrative  . Not on file   Social Determinants of Health   Financial Resource Strain:   . Difficulty of Paying Living Expenses:   Food Insecurity:   . Worried About Programme researcher, broadcasting/film/video in the Last Year:   . Barista in the Last Year:   Transportation Needs:   . Freight forwarder (Medical):   Marland Kitchen Lack of Transportation (Non-Medical):   Physical Activity:   . Days of Exercise per Week:   . Minutes of Exercise per  Session:   Stress:   . Feeling of Stress :   Social Connections:   . Frequency of Communication with Friends and Family:   . Frequency of Social Gatherings with Friends and Family:   . Attends Religious Services:   . Active Member of Clubs or Organizations:   . Attends Banker Meetings:   Marland Kitchen Marital Status:   Intimate Partner Violence:   . Fear of Current or Ex-Partner:   . Emotionally Abused:   Marland Kitchen Physically Abused:   . Sexually Abused:    Current Facility-Administered Medications on File Prior to Encounter  Medication Dose Route Frequency Provider Last Rate Last Admin  . TDaP (BOOSTRIX) injection 0.5 mL  0.5 mL Intramuscular Once Amedeo Gory, CNM       Current Outpatient Medications on File Prior to Encounter  Medication Sig Dispense Refill  . docusate sodium 100 MG CAPS Take 100 mg by mouth 2 (two) times daily as needed. 30 capsule 1  . Prenatal Vit-Fe Fumarate-FA (PRENATAL VITAMINS) 28-0.8 MG TABS Take 1 tablet by mouth.     No Known Allergies  ROS:  Review of Systems  Constitutional: Negative for chills, fatigue and fever.  Respiratory: Negative for shortness of breath.   Cardiovascular: Negative for chest pain.  Gastrointestinal: Positive for abdominal pain. Negative for nausea and vomiting.  Genitourinary: Positive for pelvic pain and vaginal bleeding. Negative for difficulty urinating, dysuria, flank pain, vaginal discharge and vaginal pain.  Neurological: Negative for dizziness and headaches.  Psychiatric/Behavioral: Negative.      I have reviewed patient's Past Medical Hx, Surgical Hx, Family Hx, Social Hx, medications and allergies.   Physical Exam   Patient Vitals for the past 24 hrs:  BP Temp Temp src Pulse Resp SpO2 Height Weight  09/03/19 2334 -- -- -- -- -- -- 4\' 9"  (1.448 m) 67 kg  09/03/19 2259 118/82 98.7 F (37.1 C) Oral 79 20 98 % -- --  09/03/19 2206 (!) 150/92 98.3 F (36.8 C) Oral 88 16 100 % -- 66.2 kg    Constitutional: Well-developed, well-nourished female in no acute distress.  Cardiovascular: normal rate Respiratory: normal effort GI: Abd soft, non-tender. Pos BS x 4 MS: Extremities nontender, no edema, normal ROM Neurologic: Alert and oriented x 4.  GU: Neg CVAT.  PELVIC EXAM: large amount of bleeding with 5-6 fox swabs used to visualize cervix and 4-5 large clots removed from vagina and cervical os with ring forceps   LAB RESULTS Results for orders placed or performed during the hospital encounter of 09/03/19 (from the past 24 hour(s))  CBC     Status: None   Collection Time: 09/04/19  1:33 AM  Result Value Ref Range   WBC 9.2 4.0 - 10.5 K/uL   RBC 4.39 3.87 - 5.11 MIL/uL   Hemoglobin 12.8 12.0 - 15.0 g/dL   HCT 09/06/19 38.1 - 82.9 %   MCV 85.0 80.0 - 100.0 fL   MCH 29.2 26.0 - 34.0 pg   MCHC 34.3 30.0 - 36.0 g/dL   RDW 93.7 16.9 - 67.8 %   Platelets 223 150 - 400 K/uL   nRBC 0.0 0.0 - 0.2 %  hCG, quantitative, pregnancy     Status: Abnormal   Collection Time: 09/04/19  1:33 AM  Result Value Ref Range   hCG, Beta Chain, Quant, S 5,816 (H) <5 mIU/mL       IMAGING 09/06/19 OB Comp Less 14 Wks  Result Date: 09/02/2019 CLINICAL DATA:  Initial evaluation for acute vaginal bleeding, pelvic pain. Early pregnancy. EXAM: OBSTETRIC <14 WK 11/02/2019 AND TRANSVAGINAL OB US TECHNIQUE: Both transabdominal and transvaginal ultrasound examinations were performed for complete evaluation of the gestation as well as the maternal uterus, adnexal regions, and pelvic cul-de-sac. Transvaginal technique was performed to assess early pregnancy. COMPARISON:  None available. FINDINGS: Intrauterine gestational sac: Single Yolk sac:  Present Embryo:  Present Cardiac Activity: Negative. Heart Rate: N/A  bpm CRL: 5.72 mm   6 w   2 d                  [place] EDC: 04/25/2020 Subchorionic hemorrhage:  None visualized. Maternal uterus/adnexae: Ovaries are within normal limits bilaterally. Corpus luteal cyst noted on the  right. No free fluid within the pelvis. IMPRESSION: 1. Single IUP with internal yolk sac and embryo, crown-rump length measuring 5.72 mm, but no detectable cardiac activity. Findings are suspicious but not yet definitive for failed pregnancy. Recommend follow-up 13/08/2019 in 10-14 days for definitive diagnosis. This recommendation follows SRU consensus guidelines: Diagnostic Criteria for Nonviable Pregnancy Early in the First Trimester. 03-18-1979 Med [id number]. 2. No other acute maternal uterine or adnexal abnormality identified. Electronically Signed   By: ; [id number] M.D.   On: 09/02/2019 01:57  US OB Transvaginal  Result Date: 09/04/2019 CLINICAL DATA:  39 year old female with vaginal bleeding. Recent pregnancy. EXAM: TRANSVAGINAL OB ULTRASOUND TECHNIQUE: Transvaginal ultrasound was performed for complete evaluation of the gestation as well as the maternal uterus, adnexal regions, and pelvic cul-de-sac. COMPARISON:  Pelvic ultrasound dated 09/02/2019. FINDINGS: The uterus is anteverted and grossly unremarkable. The endometrium is thickened with echogenic and heterogeneous content measuring 18 mm. The previously seen intrauterine gestational sac is no longer visualized in keeping with spontaneous miscarriage. The echogenic content within the endometrium most likely represent blood products/clot. There is however increased endometrial vascularity and therefore retained product of conception is not excluded. The ovaries are unremarkable. No significant free fluid in the pelvis. IMPRESSION: 1. Nonvisualization of the previously seen IUP in keeping with recent spontaneous miscarriage. 2. Thickened endometrium with echogenic content which may represent blood product or possible retained product of conception. Clinical correlation and follow-up with serial HCG levels recommended. 3. Unremarkable ovaries. Electronically Signed   By: Elgie Collard M.D.   On: 09/04/2019 03:46   US OB  Transvaginal  Result Date: 09/02/2019 CLINICAL DATA:  Initial evaluation for acute vaginal bleeding, pelvic pain. Early pregnancy. EXAM: OBSTETRIC <14 WK [place] AND TRANSVAGINAL OB US TECHNIQUE: Both transabdominal and transvaginal ultrasound examinations were performed for complete evaluation of the gestation as well as the maternal uterus, adnexal regions, and pelvic cul-de-sac. Transvaginal technique was performed to assess early pregnancy. COMPARISON:  None available. FINDINGS: Intrauterine gestational sac: Single Yolk sac:  Present Embryo:  Present Cardiac Activity: Negative. Heart Rate: N/A  bpm CRL: 5.72 mm   6 w   2 d                  [place] EDC: 04/25/2020 Subchorionic hemorrhage:  None visualized. Maternal uterus/adnexae: Ovaries are within normal limits bilaterally. Corpus luteal cyst noted on the right. No free fluid within the pelvis. IMPRESSION: 1. Single IUP with internal yolk sac and embryo, crown-rump length measuring 5.72 mm, but no detectable cardiac activity. Findings are suspicious but not yet definitive for failed pregnancy. Recommend follow-up US in 10-14 days for definitive diagnosis. This recommendation follows SRU consensus guidelines: Diagnostic Criteria for Nonviable Pregnancy Early in the First Trimester. Malva Limes Med 2013; 419:3790-24. 2. No other acute maternal uterine or adnexal abnormality identified. Electronically Signed   By: Rise Mu M.D.   On: 09/02/2019 01:57    MAU Management/MDM: Orders Placed This Encounter  Procedures  . US OB Transvaginal  . CBC  . hCG, quantitative, pregnancy  . Discharge patient    Meds ordered this encounter  Medications  . HYDROmorphone (DILAUDID) injection 1 mg  . misoprostol (CYTOTEC) tablet 600 mcg  . traMADol (ULTRAM) 50 MG tablet    Sig: Take 1-2 tablets (50-100 mg total) by mouth every 6 (six) hours as needed.    Dispense:  10 tablet    Refill:  0    Order Specific Question:   Supervising Provider    Answer:   Catalina Pizza [[id number]]    With hcg drop from 8478 to 5816, and US showing no visible IUP after IUP was seen on 3/12, likely diagnosis is SAB in progress. [place] does indicate thickened endometrium and some increased vascularity which may indicate some retained POCs. Pt bleeding is reduced significantly since clots were removed on pelvic exam. Pain reduced with Dilaudid IM dose.  Discussed options with pt including expectant management or Cytotec treatment.  Pt prefers Cytotec.  Cytotec 600 mcg given  buccally in MAU. Pt tolerated well and was discharged home. Rx for Tramadol 50 mg Q 6 hours PRN x 10 tabs.  F/U at [facility] in 2 weeks.  Pt discharged with strict return precautions.  ASSESSMENT 1. Incomplete miscarriage   2. Vaginal bleeding affecting early pregnancy   3. Vaginal bleeding in pregnancy, first trimester     PLAN Discharge home Allergies as of 09/04/2019   No Known Allergies     Medication List    TAKE these medications   DSS 100 MG Caps Take 100 mg by mouth 2 (two) times daily as needed.   Prenatal Vitamins 28-0.8 MG Tabs Take 1 tablet by mouth.   traMADol 50 MG tablet Commonly known as: ULTRAM Take 1-2 tablets (50-100 mg total) by mouth every 6 (six) hours as needed.      Follow-up [place] for [facility] Follow up.   Specialty: Obstetrics and Gynecology Why: The office will call you with a follow up appointment in 2 weeks. Return to MAU for emergencies. Contact information: [address], [address] [phone] [place] [address] [place] Certified Nurse-Midwife 09/04/2019  5:35 AM

## 2019-09-05 LAB — GC/CHLAMYDIA PROBE AMP (~~LOC~~) NOT AT ARMC
Chlamydia: NEGATIVE
Comment: NEGATIVE
Comment: NORMAL
Neisseria Gonorrhea: NEGATIVE

## 2019-09-06 LAB — SURGICAL PATHOLOGY

## 2019-09-27 ENCOUNTER — Ambulatory Visit (INDEPENDENT_AMBULATORY_CARE_PROVIDER_SITE_OTHER): Payer: Self-pay

## 2019-09-27 ENCOUNTER — Ambulatory Visit (INDEPENDENT_AMBULATORY_CARE_PROVIDER_SITE_OTHER): Payer: Self-pay | Admitting: Clinical

## 2019-09-27 ENCOUNTER — Other Ambulatory Visit: Payer: Self-pay

## 2019-09-27 VITALS — BP 151/91 | HR 68 | Wt 145.9 lb

## 2019-09-27 DIAGNOSIS — R102 Pelvic and perineal pain: Secondary | ICD-10-CM

## 2019-09-27 DIAGNOSIS — Z3009 Encounter for other general counseling and advice on contraception: Secondary | ICD-10-CM

## 2019-09-27 DIAGNOSIS — Z658 Other specified problems related to psychosocial circumstances: Secondary | ICD-10-CM

## 2019-09-27 DIAGNOSIS — R030 Elevated blood-pressure reading, without diagnosis of hypertension: Secondary | ICD-10-CM

## 2019-09-27 DIAGNOSIS — F4321 Adjustment disorder with depressed mood: Secondary | ICD-10-CM

## 2019-09-27 DIAGNOSIS — O034 Incomplete spontaneous abortion without complication: Secondary | ICD-10-CM

## 2019-09-27 DIAGNOSIS — Z789 Other specified health status: Secondary | ICD-10-CM

## 2019-09-27 NOTE — Progress Notes (Signed)
GYNECOLOGY FOLLOW UP OFFICE VISIT NOTE  History:  39 y.o. [id number] here today for follow up s/p Incomplete Ab s/p Cytotec. She denies any abnormal vaginal discharge, bleeding, or sexual activity.  Patient reports pelvic pain about 3 days a week that has a sudden onset and lasts about 3 hours.  Patient is unable to describe the pain or identify any contributing factors to the pain.  She thinks it may be related to intake because she also has bloating.  However, she reports all symptoms are relieved with tylenol.  Patient reports that she is having some difficulty coping with loss and life stressors.  However, patient does not desire future pregnancy and reports she has tried IUD and Depo in the past without satisfaction.     Past Medical History:  Diagnosis Date  . Medical history non-contributory   . No pertinent past medical history   . Preterm labor     Past Surgical History:  Procedure Laterality Date  . APPENDECTOMY    . CESAREAN SECTION     C/S x 1  . CESAREAN SECTION N/A 08/10/2012   Procedure: CESAREAN SECTION;  Surgeon: Mora Bellman, MD;  Location: [facility];  Service: Obstetrics;  Laterality: N/A;  Repeat    The following portions of the patient's history were reviewed and updated as appropriate: allergies, current medications, past family history, past medical history, past social history, past surgical history and problem list.   Health Maintenance:  Normal pap and negative HRHPV on 2013.  Normal mammogram history.   Review of Systems:  Genito-Urinary ROS: no dysuria, trouble voiding, or hematuria   Objective:  Vitals: Wt 145 lb 14.4 oz (66.2 kg)   Breastfeeding Unknown   BMI 31.57 kg/m   Physical Exam: Physical Exam Constitutional:      Appearance: Normal appearance. She is obese.  HENT:     Head: Normocephalic and atraumatic.  Eyes:     Conjunctiva/sclera: Conjunctivae normal.  Cardiovascular:     Rate and Rhythm: Normal rate.  Pulmonary:     Effort:  Pulmonary effort is normal.     Breath sounds: Normal breath sounds.  Abdominal:     General: Abdomen is flat.     Palpations: Abdomen is soft.  Musculoskeletal:        General: Normal range of motion.     Cervical back: Normal range of motion.  Neurological:     Mental Status: She is alert and oriented to person, place, and time.  Skin:    General: Skin is warm and dry.  Psychiatric:        Mood and Affect: Mood normal.        Behavior: Behavior normal.        Thought Content: Thought content normal.      Labs and Imaging: US OB Comp Less 14 Wks  Result Date: 09/02/2019 CLINICAL DATA:  Initial evaluation for acute vaginal bleeding, pelvic pain. Early pregnancy. EXAM: OBSTETRIC <14 WK [place] AND TRANSVAGINAL OB US TECHNIQUE: Both transabdominal and transvaginal ultrasound examinations were performed for complete evaluation of the gestation as well as the maternal uterus, adnexal regions, and pelvic cul-de-sac. Transvaginal technique was performed to assess early pregnancy. COMPARISON:  None available. FINDINGS: Intrauterine gestational sac: Single Yolk sac:  Present Embryo:  Present Cardiac Activity: Negative. Heart Rate: N/A  bpm CRL: 5.72 mm   6 w   2 d                  [place]  EDC: 04/25/2020 Subchorionic hemorrhage:  None visualized. Maternal uterus/adnexae: Ovaries are within normal limits bilaterally. Corpus luteal cyst noted on the right. No free fluid within the pelvis. IMPRESSION: 1. Single IUP with internal yolk sac and embryo, crown-rump length measuring 5.72 mm, but no detectable cardiac activity. Findings are suspicious but not yet definitive for failed pregnancy. Recommend follow-up US in 10-14 days for definitive diagnosis. This recommendation follows SRU consensus guidelines: Diagnostic Criteria for Nonviable Pregnancy Early in the First Trimester. Malva Limes Med 2013; 161:0960-45. 2. No other acute maternal uterine or adnexal abnormality identified. Electronically Signed   By: Rise Mu M.D.   On: 09/02/2019 01:57   US OB Transvaginal  Result Date: 09/04/2019 CLINICAL DATA:  39 year old female with vaginal bleeding. Recent pregnancy. EXAM: TRANSVAGINAL OB ULTRASOUND TECHNIQUE: Transvaginal ultrasound was performed for complete evaluation of the gestation as well as the maternal uterus, adnexal regions, and pelvic cul-de-sac. COMPARISON:  Pelvic ultrasound dated 09/02/2019. FINDINGS: The uterus is anteverted and grossly unremarkable. The endometrium is thickened with echogenic and heterogeneous content measuring 18 mm. The previously seen intrauterine gestational sac is no longer visualized in keeping with spontaneous miscarriage. The echogenic content within the endometrium most likely represent blood products/clot. There is however increased endometrial vascularity and therefore retained product of conception is not excluded. The ovaries are unremarkable. No significant free fluid in the pelvis. IMPRESSION: 1. Nonvisualization of the previously seen IUP in keeping with recent spontaneous miscarriage. 2. Thickened endometrium with echogenic content which may represent blood product or possible retained product of conception. Clinical correlation and follow-up with serial HCG levels recommended. 3. Unremarkable ovaries. Electronically Signed   By: Elgie Collard M.D.   On: 09/04/2019 03:46   US OB Transvaginal  Result Date: 09/02/2019 CLINICAL DATA:  Initial evaluation for acute vaginal bleeding, pelvic pain. Early pregnancy. EXAM: OBSTETRIC <14 WK [place] AND TRANSVAGINAL OB US TECHNIQUE: Both transabdominal and transvaginal ultrasound examinations were performed for complete evaluation of the gestation as well as the maternal uterus, adnexal regions, and pelvic cul-de-sac. Transvaginal technique was performed to assess early pregnancy. COMPARISON:  None available. FINDINGS: Intrauterine gestational sac: Single Yolk sac:  Present Embryo:  Present Cardiac Activity: Negative. Heart  Rate: N/A  bpm CRL: 5.72 mm   6 w   2 d                  [place] EDC: 04/25/2020 Subchorionic hemorrhage:  None visualized. Maternal uterus/adnexae: Ovaries are within normal limits bilaterally. Corpus luteal cyst noted on the right. No free fluid within the pelvis. IMPRESSION: 1. Single IUP with internal yolk sac and embryo, crown-rump length measuring 5.72 mm, but no detectable cardiac activity. Findings are suspicious but not yet definitive for failed pregnancy. Recommend follow-up US in 10-14 days for definitive diagnosis. This recommendation follows SRU consensus guidelines: Diagnostic Criteria for Nonviable Pregnancy Early in the First Trimester. Malva Limes Med 2013; 409:8119-14. 2. No other acute maternal uterine or adnexal abnormality identified. Electronically Signed   By: Rise Mu M.D.   On: 09/02/2019 01:57    Assessment & Plan:  39 year old 1. Incomplete abortion   2. Psychosocial stressors   3. Cyclical pelvic pain   4. Birth control counseling   5. Elevated blood pressure reading   6. Language barrier    -Instructed to continue tylenol dosing for pelvic pain. -Reviewed need for updated pap and pelvic exam to further evaluate pelvic pain. -Encouraged completion of financial assistance for pap smear  and BCM assistance. -Reviewed BCMs by tiered approach.   -Recommended contacting PCP or clinic regarding blood pressure monitoring -Referral for BHI placed today for assistance with coping. -Interpretations completed in person by [facility] -Patient to RTO prn   Total face-to-face time with patient: 15 minutes   Gerrit Heck, [place] 09/27/2019 8:38 AM

## 2019-09-27 NOTE — Patient Instructions (Signed)

## 2019-09-27 NOTE — Patient Instructions (Signed)
Duelo complicado Complicated Grief El duelo es [place] normal a la muerte de una persona Special educational needs teacher. Casi todos los que pierden a un ser querido pueden verse afectados por sentimientos de Physicist, medical, enojo y Banker. Tambin es comn que haya sntomas de depresin durante el duelo, entre ellos, problemas para dormir, falta de apetito y falta de [place], que pueden durar semanas o meses despus de la prdida. El duelo complicado es diferente al duelo normal o a la depresin. El duelo normal conlleva tristeza y sentimientos de prdida, pero esas sensaciones mejoran y sanan con Museum/gallery conservator. El duelo complicado es un tipo grave de duelo que dura mucho tiempo, por lo general perdura varios meses, un ao o ms, e interfiere en su capacidad de actuar con normalidad. El duelo complicado puede requerir tratamiento a cargo de un mdico especialista en salud mental. Cules son las causas? Se desconoce la causa de esta afeccin. No est claro por qu algunas personas siguen lidiando con el duelo y [place] no. Qu incrementa el riesgo? Es ms probable que usted sufra esta afeccin si:  La muerte del ser querido fue repentina o inesperada.  La muerte del ser querido se debi a un episodio violento.  La causa de la muerte de su ser querido fue suicidio.  El ser querido era un nio o una persona joven.  Usted tena una conexin muy fuerte con su ser querido, o dependa de l o ella.  Tiene antecedentes de depresin o ansiedad. Cules son los signos o los sntomas? Los sntomas de esta afeccin [employer] siguientes:  Sentimientos de incredulidad o falta de emociones (embotamiento [place]).  Incapacidad de disfrutar de los buenos recuerdos del ser querido.  Necesidad de Automotive engineer todo o algunas cosas que le recuerdan al ser querido.  Incapacidad de dejar de pensar acerca de la muerte.  Sentimientos de ira o culpa intensas.  Sentimientos de soledad y desesperanza.  Sentimientos de que la vida carece de  sentido y est vaca.  Prdida del deseo de seguir adelante con su vida. Cmo se diagnostica? Esta afeccin se puede diagnosticar en funcin de lo siguiente:  Sus sntomas. El duelo complicado se diagnostica si tiene sntomas persistentes de duelo durante 6 a 12 meses o ms.  El efecto de los sntomas en su vida. Pueden diagnosticarle esta afeccin si los sntomas interfieren en su capacidad de llevar una vida normal. Es posible que el mdico le recomiende ver a Music therapist en salud mental. Muchos sntomas de depresin son similares a los del duelo complicado. Es importante someterse a evaluaciones para diagnosticar el duelo complicado junto con otras enfermedades Vergas. Cmo se trata? Esta afeccin se trata frecuentemente con psicoterapia. Esta terapia la brinde Music therapist en salud mental (psiquiatra). Durante la terapia:  Aprender formas positivas de sobrellevar la prdida de su ser querido.  El Glass blower/designer en salud mental tambin puede indicarle antidepresivos. Siga estas indicaciones en su casa: Estilo de vida   Cudese a s mismo. ? Coma con regularidad y siga una dieta saludable. Coma muchas frutas, verduras, protenas magras y cereales integrales. ? Intente IT sales professional de ejercicio [employer]. Haga 30 minutos de ejercicio la [employer] de la [place]. ? Mantenga un esquema de descanso sistemtico. Trate de dormir 8horas o ms todas las noches. ? Comience a hacer cosas que sola disfrutar.  No consuma drogas ni alcohol para aliviar los sntomas.  Pase tiempo con amigos y seres queridos. Instrucciones generales  [employer] de venta [place]  y los recetados solamente como se lo haya indicado el mdico.  Considere la posibilidad de unirse a un grupo de apoyo para casos de duelo (sentimiento de prdida) como ayuda para sobrellevar la prdida.  Concurra a todas las visitas de control como se lo haya indicado el mdico. Esto es  importante. Comunquese con un mdico si:  Sus sntomas le impiden desempear sus funciones con normalidad.  Los sntomas no mejoran con Dispensing optician. Solicite ayuda de inmediato si:  Piensa seriamente en lastimarse a usted mismo o a Nurse, children's.  Tiene sentimientos suicidas. Si alguna vez siente que puede lastimarse a usted mismo o a Producer, television/film/video, o tiene pensamientos de poner fin a su vida, busque ayuda de inmediato. Puede dirigirse al servicio de emergencias ms cercano o comunicarse con:  El servicio de emergencias de su localidad (911 en [place]  Una lnea de asistencia al suicida y Freight forwarder en crisis, como la [employer] de Prevencin del Suicidio (National Suicide Prevention Lifeline), al [phone]. Est disponible las 24 horas del da. Resumen  El duelo complicado es un tipo grave de duelo que dura [place]. Es probable que este duelo no desaparecer por s solo. Solicite la [employer] necesite.  Algunos duelos son ms difciles que otros y pueden causar esta afeccin. Tal vez necesite un tipo de tratamiento para ayudar a recuperarse si la prdida de su ser querido fue repentina, violenta o la cause de un suicidio.  Tal vez se sienta culpable por seguir con su vida. Recibir ayuda no significa que usted se est olvidando de su ser querido. Significa que se est cuidado a s mismo.  El duelo complicado se trata mejor con psicoterapia. Adems, es posible que le receten medicamentos.  Solicite la [employer] necesite, y encuentre el apoyo que lo ayudar a recuperarse. Esta informacin no tiene Marine scientist el consejo del mdico. Asegrese de hacerle al mdico cualquier pregunta que tenga. Document Revised: 05/31/2017 Document Reviewed: 05/31/2017 Elsevier Patient Education  [place].

## 2019-09-27 NOTE — BH Specialist Note (Signed)
Integrated Behavioral Health Initial Visit  MRN: [id number] Name: Kathryn Glenn  Number of Integrated Behavioral Health Clinician visits:: 1/6 Session Start time: 9:03 Session End time: 9:13 Total time: 10  Type of Service: Integrated Behavioral Health- Individual/Family Interpretor:Yes.   Interpretor Name and Language: Elane Fritz; Spanish   Warm Hand Off Completed.       SUBJECTIVE: [name] is a 39 y.o. female accompanied by n/a Patient was referred by Gerrit Heck, CNM for Grief. Patient reports the following symptoms/concerns: Pt's primary concern today is grieving loss of pregnancy; family has been supportive Duration of problem: Over 2 weeks;   OBJECTIVE: Mood and Affect: Appropriate Risk of harm to self or others: No plan to harm self or others  LIFE CONTEXT: Family and Social: Pt lives with her 4 children School/Work: - Self-Care: - Life Changes: Recent miscarriage  GOALS ADDRESSED: Patient will: 1. Increase knowledge and/or ability of: healthy habits  2. Demonstrate ability to: Continue healthy grieving over loss  INTERVENTIONS: Interventions utilized: Supportive Counseling  Standardized Assessments completed: GAD-7 and PHQ 9  ASSESSMENT: Patient currently experiencing Grief.   Patient may benefit from psychoeducation and brief therapeutic interventions regarding coping with symptoms of normal grief .  PLAN: 1. Follow up with behavioral health clinician on : One week 2. Behavioral recommendations:  -Continue to allow family to be supportive -Consider prioritizing healthy sleep and self-care for the next week, daily 3. Referral(s): Integrated [employer] [facility] (In Clinic)  [address] [place], [place]  Depression screen [facility] 2/9 09/27/2019  Decreased Interest 1  Down, Depressed, Hopeless 1  PHQ - 2 Score 2  Altered sleeping 1  Tired, decreased energy 2  Change in appetite 1  Feeling bad or failure about yourself  2  Trouble  concentrating 0  Moving slowly or fidgety/restless 1  Suicidal thoughts 0  PHQ-9 Score 9   GAD 7 : Generalized Anxiety Score 09/27/2019  Nervous, Anxious, on Edge 0  Control/stop worrying 0  Worry too much - different things 2  Trouble relaxing 1  Restless 0  Easily annoyed or irritable 0  Afraid - awful might happen 1  Total GAD 7 Score 4

## 2019-10-04 ENCOUNTER — Ambulatory Visit (INDEPENDENT_AMBULATORY_CARE_PROVIDER_SITE_OTHER): Payer: Self-pay | Admitting: Clinical

## 2019-10-04 ENCOUNTER — Telehealth: Payer: Self-pay

## 2019-10-04 ENCOUNTER — Other Ambulatory Visit: Payer: Self-pay

## 2019-10-04 DIAGNOSIS — Z789 Other specified health status: Secondary | ICD-10-CM

## 2019-10-04 DIAGNOSIS — Z5989 Other problems related to housing and economic circumstances: Secondary | ICD-10-CM

## 2019-10-04 DIAGNOSIS — Z550 Illiteracy and low-level literacy: Secondary | ICD-10-CM

## 2019-10-04 DIAGNOSIS — Z603 Acculturation difficulty: Secondary | ICD-10-CM

## 2019-10-04 DIAGNOSIS — Z5971 Insufficient health insurance coverage: Secondary | ICD-10-CM

## 2019-10-04 DIAGNOSIS — F4321 Adjustment disorder with depressed mood: Secondary | ICD-10-CM

## 2019-10-04 DIAGNOSIS — Z598 Other problems related to housing and economic circumstances: Secondary | ICD-10-CM

## 2019-10-04 NOTE — BH Specialist Note (Signed)
Integrated Behavioral Health via Telemedicine Video Visit  10/04/2019 Kathryn Glenn [id number]  Number of Integrated Behavioral Health visits: 2 Session Start time: 10:06  Session End time: 10:21 Total time: 15  Referring Provider: Gerrit Heck, CNM Type of Visit: Video Patient/Family location: Home [facility] Provider location: [facility] All persons participating in visit: Patient Kathryn Glenn and [facility] Kathryn Glenn [name] and Spanish Interpreter [phone]   Confirmed patient's address: Yes  Confirmed patient's phone number: Yes  Any changes to demographics: No   Confirmed patient's insurance: Yes  Any changes to patient's insurance: No   Discussed confidentiality: At previous visit  I connected with Kathryn Glenn by a video enabled telemedicine application and verified that I am speaking with the correct person using two identifiers.     I discussed the limitations of evaluation and management by telemedicine and the availability of in person appointments.  I discussed that the purpose of this visit is to provide behavioral health care while limiting exposure to the novel coronavirus.   Discussed there is a possibility of technology failure and discussed alternative modes of communication if that failure occurs.  I discussed that engaging in this video visit, they consent to the provision of behavioral healthcare and the services will be billed under their insurance.  Patient and/or legal guardian expressed understanding and consented to video visit: Yes   PRESENTING CONCERNS: Patient and/or family reports the following symptoms/concerns: Pt says she is coping well with her grief, but concerned she doesn't feel well and thinks her blood pressure may be elevated; does not have a BP monitor.  Duration of problem: grief over 3 weeks;   STRENGTHS (Protective Factors/Coping Skills): Supportive family  GOALS ADDRESSED: Patient will: 1.  Demonstrate ability to: continue  healthy grieving over loss  INTERVENTIONS: Interventions utilized:  Supportive Counseling Standardized Assessments completed: Not Needed  ASSESSMENT: Patient currently experiencing Grief, Illiteracy, Uninsured, and Language barrier   Patient may benefit from brief therapeutic intervention today and referral to PCP.  PLAN: 1. Follow up with behavioral health clinician on : As needed 2. Behavioral recommendations:  -Await call from clinical staff about medical concern -Call [facility] to establish as a new patient asap -Continue allowing healthy grief 3. Referral(s): Integrated [employer] (In Clinic)  I discussed the assessment and treatment plan with the patient and/or parent/guardian. They were provided an opportunity to ask questions and all were answered. They agreed with the plan and demonstrated an understanding of the instructions.   They were advised to call back or seek an in-person evaluation if the symptoms worsen or if the condition fails to improve as anticipated.  Kathryn Glenn

## 2019-10-04 NOTE — Patient Instructions (Signed)
[id number]

## 2019-10-04 NOTE — Telephone Encounter (Signed)
Called pt using Spanish Pacific Interpreter [place] id# [phone], to make sure that she is aware of her appt on 10/06/19 @ 9:20a, patient advised yes, asked if she had any symptoms such as Headaches, dizziness, nausea, seeing spots. Pt states headaches w/ nausea, & taking Tylenol but she feels that its making things worse. Advised pt she may want to go to Urgent Care or ED if persist. Pt verbalized understanding.

## 2019-10-06 ENCOUNTER — Ambulatory Visit: Payer: Self-pay

## 2019-11-29 ENCOUNTER — Telehealth: Payer: Self-pay | Admitting: Licensed Clinical Social Worker

## 2019-11-29 NOTE — Telephone Encounter (Signed)
Call placed to patient utilizing [employer] Aniceto Boss [phone]) regarding request to assist patient with establishing primary care services. A message requesting a return call was left.

## 2019-12-30 ENCOUNTER — Telehealth: Payer: Self-pay | Admitting: Licensed Clinical Social Worker

## 2019-12-30 NOTE — Telephone Encounter (Signed)
Call placed to patient utilizing [employer] [facility] NT#[id number]) LCSW introduced self and explained role at [facility]. Pt was scheduled an appointment at [facility] to establish care. Clinic's contact information was emailed to [email or web address]  No additional concerns noted.

## 2020-01-20 ENCOUNTER — Ambulatory Visit (INDEPENDENT_AMBULATORY_CARE_PROVIDER_SITE_OTHER): Payer: Self-pay | Admitting: Primary Care

## 2020-02-03 ENCOUNTER — Other Ambulatory Visit: Payer: Self-pay

## 2020-02-03 ENCOUNTER — Ambulatory Visit (INDEPENDENT_AMBULATORY_CARE_PROVIDER_SITE_OTHER): Payer: Self-pay

## 2020-02-03 DIAGNOSIS — Z23 Encounter for immunization: Secondary | ICD-10-CM

## 2020-03-01 ENCOUNTER — Other Ambulatory Visit: Payer: Self-pay

## 2020-03-01 ENCOUNTER — Ambulatory Visit (INDEPENDENT_AMBULATORY_CARE_PROVIDER_SITE_OTHER): Payer: Self-pay

## 2020-03-01 VITALS — Wt 143.0 lb

## 2020-03-01 DIAGNOSIS — Z23 Encounter for immunization: Secondary | ICD-10-CM

## 2020-03-01 NOTE — Progress Notes (Signed)
   Covid-19 Vaccination Clinic  Name:  Adama Ivins    MRN: [id number] DOB: Oct 06, 1980  03/01/2020  Ms. Rainelle Sulewski was observed post Covid-19 immunization for 15 minutes without incident. She was provided with Vaccine Information Sheet and instruction to access the V-Safe system.   Ms. Adrianne Shackleton was instructed to call 911 with any severe reactions post vaccine: Marland Kitchen Difficulty breathing  . Swelling of face and throat  . A fast heartbeat  . A bad rash all over body  . Dizziness and weakness   Immunizations Administered    Name Date Dose VIS Date Route   Pfizer COVID-19 Vaccine 03/01/2020  9:30 AM 0.3 mL 08/17/2018 Intramuscular   Manufacturer: [employer], [employer]   Lot: [id number]   NDC: [address]-9

## 2020-05-11 ENCOUNTER — Encounter: Payer: Self-pay | Admitting: Family Medicine

## 2020-05-11 ENCOUNTER — Other Ambulatory Visit: Payer: Self-pay

## 2020-05-11 ENCOUNTER — Ambulatory Visit (INDEPENDENT_AMBULATORY_CARE_PROVIDER_SITE_OTHER): Payer: Self-pay

## 2020-05-11 ENCOUNTER — Ambulatory Visit (INDEPENDENT_AMBULATORY_CARE_PROVIDER_SITE_OTHER): Payer: Self-pay | Admitting: Family Medicine

## 2020-05-11 VITALS — BP 137/88 | HR 57 | Temp 98.4°F | Resp 16 | Wt 145.0 lb

## 2020-05-11 DIAGNOSIS — L089 Local infection of the skin and subcutaneous tissue, unspecified: Secondary | ICD-10-CM

## 2020-05-11 DIAGNOSIS — S60312A Abrasion of left thumb, initial encounter: Secondary | ICD-10-CM

## 2020-05-11 LAB — POCT CBC
Granulocyte percent: 47.8 %G (ref 37–80)
HCT, POC: 38.1 % (ref 29–41)
Hemoglobin: 12.7 g/dL (ref 11–14.6)
Lymph, poc: 2.5 (ref 0.6–3.4)
MCH, POC: 28.5 pg (ref 27–31.2)
MCHC: 33.3 g/dL (ref 31.8–35.4)
MCV: 85.5 fL (ref 76–111)
MID (cbc): 0.5 (ref 0–0.9)
MPV: 9.2 fL (ref 0–99.8)
POC Granulocyte: 2.8 (ref 2–6.9)
POC LYMPH PERCENT: 43.1 %L (ref 10–50)
POC MID %: 9.1 %M (ref 0–12)
Platelet Count, POC: 208 10*3/uL (ref 142–424)
RBC: 4.46 M/uL (ref 4.04–5.48)
RDW, POC: 14.9 %
WBC: 5.8 10*3/uL (ref 4.6–10.2)

## 2020-05-11 MED ORDER — AMOXICILLIN-POT CLAVULANATE 875-125 MG PO TABS: 1.0000 | ORAL_TABLET | Freq: Two times a day (BID) | ORAL | 0 refills | Status: DC

## 2020-05-11 NOTE — Patient Instructions (Addendum)
  It appears you have probably got an infection in that thumb, though your labs and x-ray appear normal.  I am going to go ahead and place you on an antibiotic, Augmentin (amoxicillin/clavulanate) to take 1 twice daily for 7 to 10 days.   If this is getting at all worse return or go to an urgent care or emergency room if necessary.  If it does not resolve completely come back.   If you have lab work done today you will be contacted with your lab results within the next 2 weeks.  If you have not heard from [place] then please contact us. The fastest way to get your results is to register for My Chart.   IF you received an x-ray today, you will receive an invoice from [facility]. Please contact [facility] at [phone] with questions or concerns regarding your invoice.   IF you received labwork today, you will receive an invoice from [place]. Please contact [facility] at [phone] with questions or concerns regarding your invoice.   Our billing staff will not be able to assist you with questions regarding bills from these companies.  You will be contacted with the lab results as soon as they are available. The fastest way to get your results is to activate your My Chart account. Instructions are located on the last page of this paperwork. If you have not heard from [place] regarding the results in 2 weeks, please contact this office.

## 2020-05-11 NOTE — Progress Notes (Signed)
Patient ID: Kathryn Glenn, female    DOB: 11/01/1980  Age: 39 y.o. MRN: [id number]  Chief Complaint  Patient presents with  . Cellulitis    of her Lt index finger x2 months, pt noticed pain and redness, notes it has been itchy, started as a small scrape on her hand but has gotten worse     Subjective:   Patient here complaining of pain in her left thumb for the last few days.  About 2 months ago she had a wound of some sort on the thenar eminence of the thumb.  It healed over completely but now it is getting swollen and painful in the same area.  She does not remember exactly how the injury happened and whether there could be any foreign body in there.  She does work at [employer] and handles a lot of [employer].  Otherwise is healthy.  This is her first visit here.  Her last menstrual cycle started yesterday.  Denies any chance of pregnancy.  Spoke to her using the interpreter machine with an interpreter named Lurena Joiner.  The daughter is in the room also when she speaks some English but the patient is Spanish-speaking..  Current allergies, medications, problem list, past/family and social histories reviewed.  Objective:  BP 137/88   Pulse (!) 57   Temp 98.4 F (36.9 C) (Temporal)   Resp 16   Wt 145 lb (65.8 kg)   SpO2 98%   BMI 31.38 kg/m   No major acute distress.  Left thumb is swollen and tender on the thenar eminence at the base of the left thumb.  Minimally warm and minimally erythematous.  Quite tender.  The joint itself does not seem to be tender.  I reviewed the x-ray films and do not see any abnormality.  Neurology reading is still pending.  Results for orders placed or performed in visit on 05/11/20  POCT CBC  Result Value Ref Range   WBC 5.8 4.6 - 10.2 K/uL   Lymph, poc 2.5 0.6 - 3.4   POC LYMPH PERCENT 43.1 10 - 50 %L   MID (cbc) 0.5 0 - 0.9   POC MID % 9.1 0 - 12 %M   POC Granulocyte 2.8 2 - 6.9   Granulocyte percent 47.8 37 - 80 %G   RBC 4.46 4.04 - 5.48 M/uL    Hemoglobin 12.7 11 - 14.6 g/dL   HCT, POC 50.3 29 - 41 %   MCV 85.5 76 - 111 fL   MCH, POC 28.5 27 - 31.2 pg   MCHC 33.3 31.8 - 35.4 g/dL   RDW, POC 54.6 %   Platelet Count, POC 208 142 - 424 K/uL   MPV 9.2 0 - 99.8 fL     Assessment & Plan:   Assessment: 1. Abrasion of thumb, infected, left, initial encounter       Plan: CBC and x-ray  IMPRESSION: Mild soft tissue swelling. No evidence of foreign body or acute osseous findings.  In the absence of any clear diagnosis I am going ahead and place her on antibiotic.  Return if at all worse. Orders Placed This Encounter  Procedures  . DG Finger Thumb Left    Standing Status:   Future    Number of Occurrences:   1    Standing Expiration Date:   05/11/2021    Order Specific Question:   Reason for Exam (SYMPTOM  OR DIAGNOSIS REQUIRED)    Answer:   Skin injury 2 months ago  now infected.  Look for foreign body    Order Specific Question:   Is the patient pregnant?    Answer:   No    Order Specific Question:   Preferred imaging location?    Answer:   External  . POCT CBC    No orders of the defined types were placed in this encounter.        Patient Instructions    It appears you have probably got an infection in that thumb, though your labs and x-ray appear normal.  I am going to go ahead and place you on an antibiotic, Augmentin (amoxicillin/clavulanate) to take 1 twice daily for 7 to 10 days.   If this is getting at all worse return or go to an urgent care or emergency room if necessary.  If it does not resolve completely come back.   If you have lab work done today you will be contacted with your lab results within the next 2 weeks.  If you have not heard from [place] then please contact us. The fastest way to get your results is to register for My Chart.   IF you received an x-ray today, you will receive an invoice from [facility]. Please contact [facility] at [phone] with questions or  concerns regarding your invoice.   IF you received labwork today, you will receive an invoice from [place]. Please contact [facility] at [phone] with questions or concerns regarding your invoice.   Our billing staff will not be able to assist you with questions regarding bills from these companies.  You will be contacted with the lab results as soon as they are available. The fastest way to get your results is to activate your My Chart account. Instructions are located on the last page of this paperwork. If you have not heard from [place] regarding the results in 2 weeks, please contact this office.        Return if symptoms worsen or fail to improve.   Janace Hoard, MD 05/11/2020

## 2020-06-28 ENCOUNTER — Ambulatory Visit (INDEPENDENT_AMBULATORY_CARE_PROVIDER_SITE_OTHER): Payer: Self-pay

## 2020-06-28 ENCOUNTER — Ambulatory Visit (INDEPENDENT_AMBULATORY_CARE_PROVIDER_SITE_OTHER): Payer: Self-pay | Admitting: Family Medicine

## 2020-06-28 ENCOUNTER — Other Ambulatory Visit: Payer: Self-pay

## 2020-06-28 VITALS — BP 138/84 | HR 83 | Temp 98.5°F | Resp 16 | Ht <= 58 in | Wt 144.8 lb

## 2020-06-28 DIAGNOSIS — M79641 Pain in right hand: Secondary | ICD-10-CM

## 2020-06-28 DIAGNOSIS — N926 Irregular menstruation, unspecified: Secondary | ICD-10-CM

## 2020-06-28 DIAGNOSIS — M79645 Pain in left finger(s): Secondary | ICD-10-CM

## 2020-06-28 DIAGNOSIS — M79642 Pain in left hand: Secondary | ICD-10-CM

## 2020-06-28 DIAGNOSIS — M79644 Pain in right finger(s): Secondary | ICD-10-CM

## 2020-06-28 LAB — POCT URINE PREGNANCY: Preg Test, Ur: NEGATIVE

## 2020-06-28 NOTE — Patient Instructions (Addendum)
  Take diclofenac 75 mg 1 twice daily with breakfast and with dinner.  Try to avoid doing activities with your hands which cause increased pain or irritation.  In addition to the diclofenac you can continue taking Tylenol 500 mg 2 pills 3 times daily (acetaminophen)  Referral is being made to a hand specialist.  If the pain is doing much better and the problem has gone away you can cancel the appointment.  I am checking some blood work on you to check for some of the arthritic diseases that can cause thumb pain.  I will let you know the results of those labs.  Return as needed  If you have lab work done today you will be contacted with your lab results within the next 2 weeks.  If you have not heard from [place] then please contact us. The fastest way to get your results is to register for My Chart.   IF you received an x-ray today, you will receive an invoice from [facility]. Please contact [facility] at [phone] with questions or concerns regarding your invoice.   IF you received labwork today, you will receive an invoice from [place]. Please contact [facility] at [phone] with questions or concerns regarding your invoice.   Our billing staff will not be able to assist you with questions regarding bills from these companies.  You will be contacted with the lab results as soon as they are available. The fastest way to get your results is to activate your My Chart account. Instructions are located on the last page of this paperwork. If you have not heard from [place] regarding the results in 2 weeks, please contact this office.

## 2020-06-28 NOTE — Progress Notes (Signed)
Patient ID: Kathryn Glenn, female    DOB: 05-28-1981  Age: 40 y.o. MRN: [id number]  Chief Complaint  Patient presents with  . Hand Pain    Pt notes has had some pain and swelling in her thumbs, denies injury puss redness or heat to the touch, denies injury as well but this has been ongoing for several months     Subjective:   Patient comes in complaining of bilateral thumb pain.  I saw her previously a couple of months ago at which time I thought she might have a little infection there.  She has persisted with thumb pain, and now is transferred to both thumbs.  It hurts her so much she has not been able to work at times, but she wants to continue working daily.  She has no pain in the other fingers, but both thumbs hurt her other.  They are very tender to touch.  No other joint problems of wrists or elbows or shoulders or back or hips or knees or feet.  Tylenol did not help her.  Current allergies, medications, problem list, past/family and social histories reviewed.  Objective:  BP 138/84   Pulse 83   Temp 98.5 F (36.9 C) (Temporal)   Resp 16   Ht 4\' 9"  (1.448 m)   Wt 144 lb 12.8 oz (65.7 kg)   SpO2 100%   BMI 31.33 kg/m   No major acute distress.  Both thumbs are very tender to touch along the thenar eminence and the MCP joints primarily.  Minimal erythema but she has been rubbing at them.  Pain on apposition.  Good grip with the other 4 fingers.  Good range of motion of wrist joints without pain.  X-rays are negative  Assessment & Plan:   Assessment: 1. Bilateral thumb pain   2. Bilateral hand pain   3. Missed period       Plan: Treat with diclofenac, check rheumatologic studies of ANA, RA, and sed rate.  We will go ahead and set her up a referral to a rheumatologist.  It may take some time, and I told her if the pain subsided completely on the diclofenac she could cancel the appointment.  The visit was done using an online interpreter.  The patient does speak  some English but preferred to use of this route.  Orders Placed This Encounter  Procedures  . DG Hand Complete Left    Order Specific Question:   Reason for Exam (SYMPTOM  OR DIAGNOSIS REQUIRED)    Answer:   pain in hand    Order Specific Question:   Is the patient pregnant?    Answer:   No    Order Specific Question:   Preferred imaging location?    Answer:   Internal    Order Specific Question:   Release to patient    Answer:   Immediate  . DG Hand Complete Right    Order Specific Question:   Reason for Exam (SYMPTOM  OR DIAGNOSIS REQUIRED)    Answer:   pain    Order Specific Question:   Is the patient pregnant?    Answer:   No    Order Specific Question:   Preferred imaging location?    Answer:   Internal    Order Specific Question:   Release to patient    Answer:   Immediate  . Sedimentation rate  . Rheumatoid factor  . ANA  . Ambulatory referral to Rheumatology    Referral Priority:  Routine    Referral Type:   Consultation    Referral Reason:   Specialty Services Required    Referred to Provider:   Zenovia Jordan, MD    Requested Specialty:   Rheumatology    Number of Visits Requested:   1  . POCT urine pregnancy    No orders of the defined types were placed in this encounter.        Patient Instructions    Take diclofenac 75 mg 1 twice daily with breakfast and with dinner.  Try to avoid doing activities with your hands which cause increased pain or irritation.  In addition to the diclofenac you can continue taking Tylenol 500 mg 2 pills 3 times daily (acetaminophen)  Referral is being made to a hand specialist.  If the pain is doing much better and the problem has gone away you can cancel the appointment.  I am checking some blood work on you to check for some of the arthritic diseases that can cause thumb pain.  I will let you know the results of those labs.  Return as needed  If you have lab work done today you will be contacted with your lab  results within the next 2 weeks.  If you have not heard from [place] then please contact us. The fastest way to get your results is to register for My Chart.   IF you received an x-ray today, you will receive an invoice from [facility] Radiology. Please contact [facility] at [phone] with questions or concerns regarding your invoice.   IF you received labwork today, you will receive an invoice from [facility]. Please contact [facility] at [phone] with questions or concerns regarding your invoice.   Our billing staff will not be able to assist you with questions regarding bills from these companies.  You will be contacted with the lab results as soon as they are available. The fastest way to get your results is to activate your My Chart account. Instructions are located on the last page of this paperwork. If you have not heard from [place] regarding the results in 2 weeks, please contact this office.        Return if symptoms worsen or fail to improve.   Janace Hoard, MD 06/28/2020

## 2020-06-29 LAB — RHEUMATOID FACTOR: Rheumatoid fact SerPl-aCnc: <10 IU/mL (ref <14.0)

## 2020-06-29 LAB — ANA: Anti Nuclear Antibody (ANA): NEGATIVE

## 2020-06-29 LAB — SEDIMENTATION RATE: Sed Rate: 28 mm/hr (ref 0–32)

## 2020-07-03 ENCOUNTER — Telehealth: Payer: Self-pay

## 2020-07-18 ENCOUNTER — Telehealth: Payer: Self-pay | Admitting: General Practice

## 2020-07-18 NOTE — Telephone Encounter (Signed)
patient called to inquire about recent referral to [facility] Rheumatology   Patient states that the office requested additional information / and canceled referral      Please advise  And can you resubmit referral

## 2020-07-19 NOTE — Telephone Encounter (Signed)
What other information did they need so Kathryn can reorder appropriately

## 2020-07-19 NOTE — Telephone Encounter (Signed)
This was handled by Kathryn Glenn today no further info required

## 2020-10-08 ENCOUNTER — Ambulatory Visit: Payer: Self-pay | Admitting: Emergency Medicine

## 2020-10-08 DIAGNOSIS — Z0289 Encounter for other administrative examinations: Secondary | ICD-10-CM

## 2020-10-08 IMAGING — DX DG CHEST 2V
2 series · 2 of 2 positions shown · non-contrast
Comparison: None.

CLINICAL DATA: Shortness of breath

EXAM:
CHEST - 2 VIEW

[chest pa]
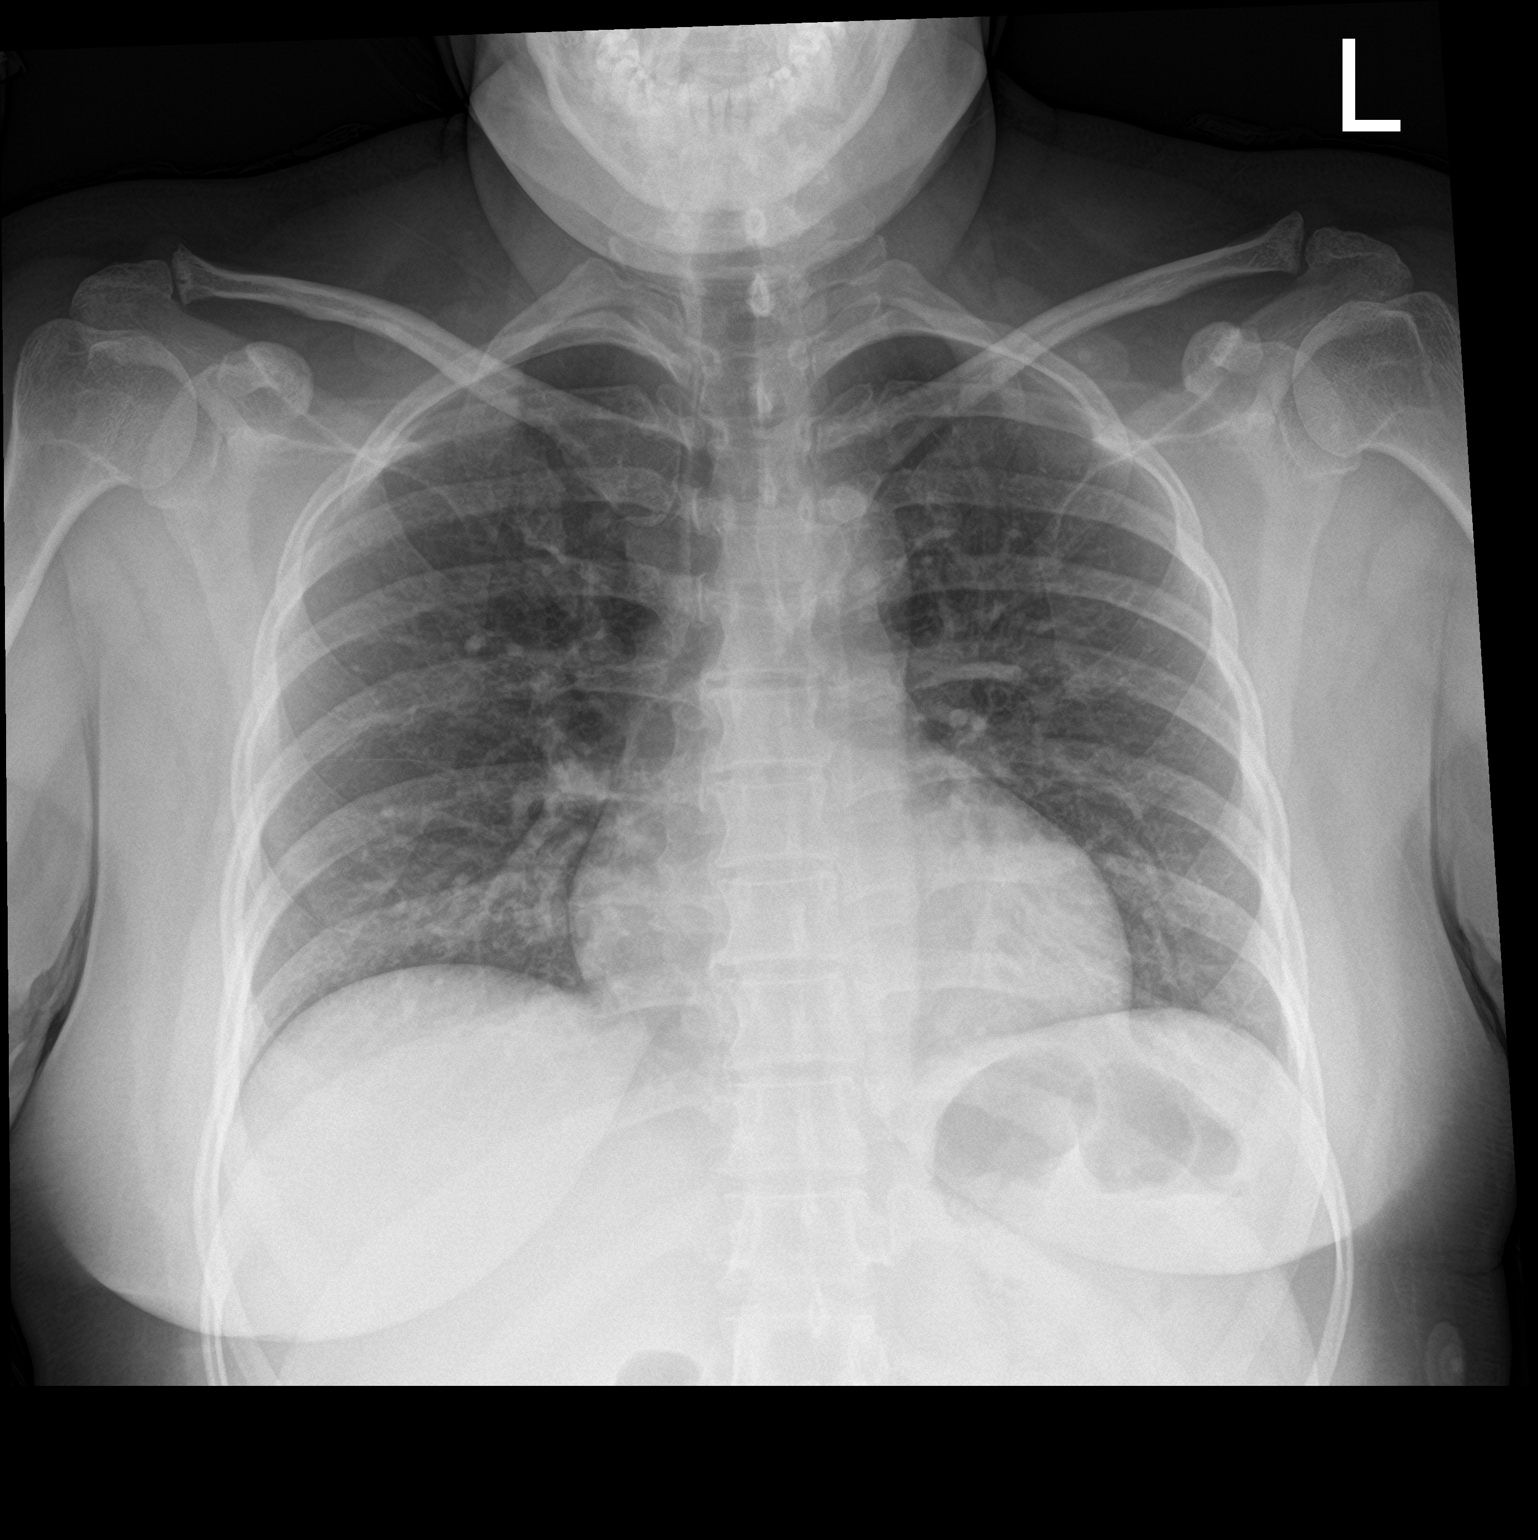

[chest lat]
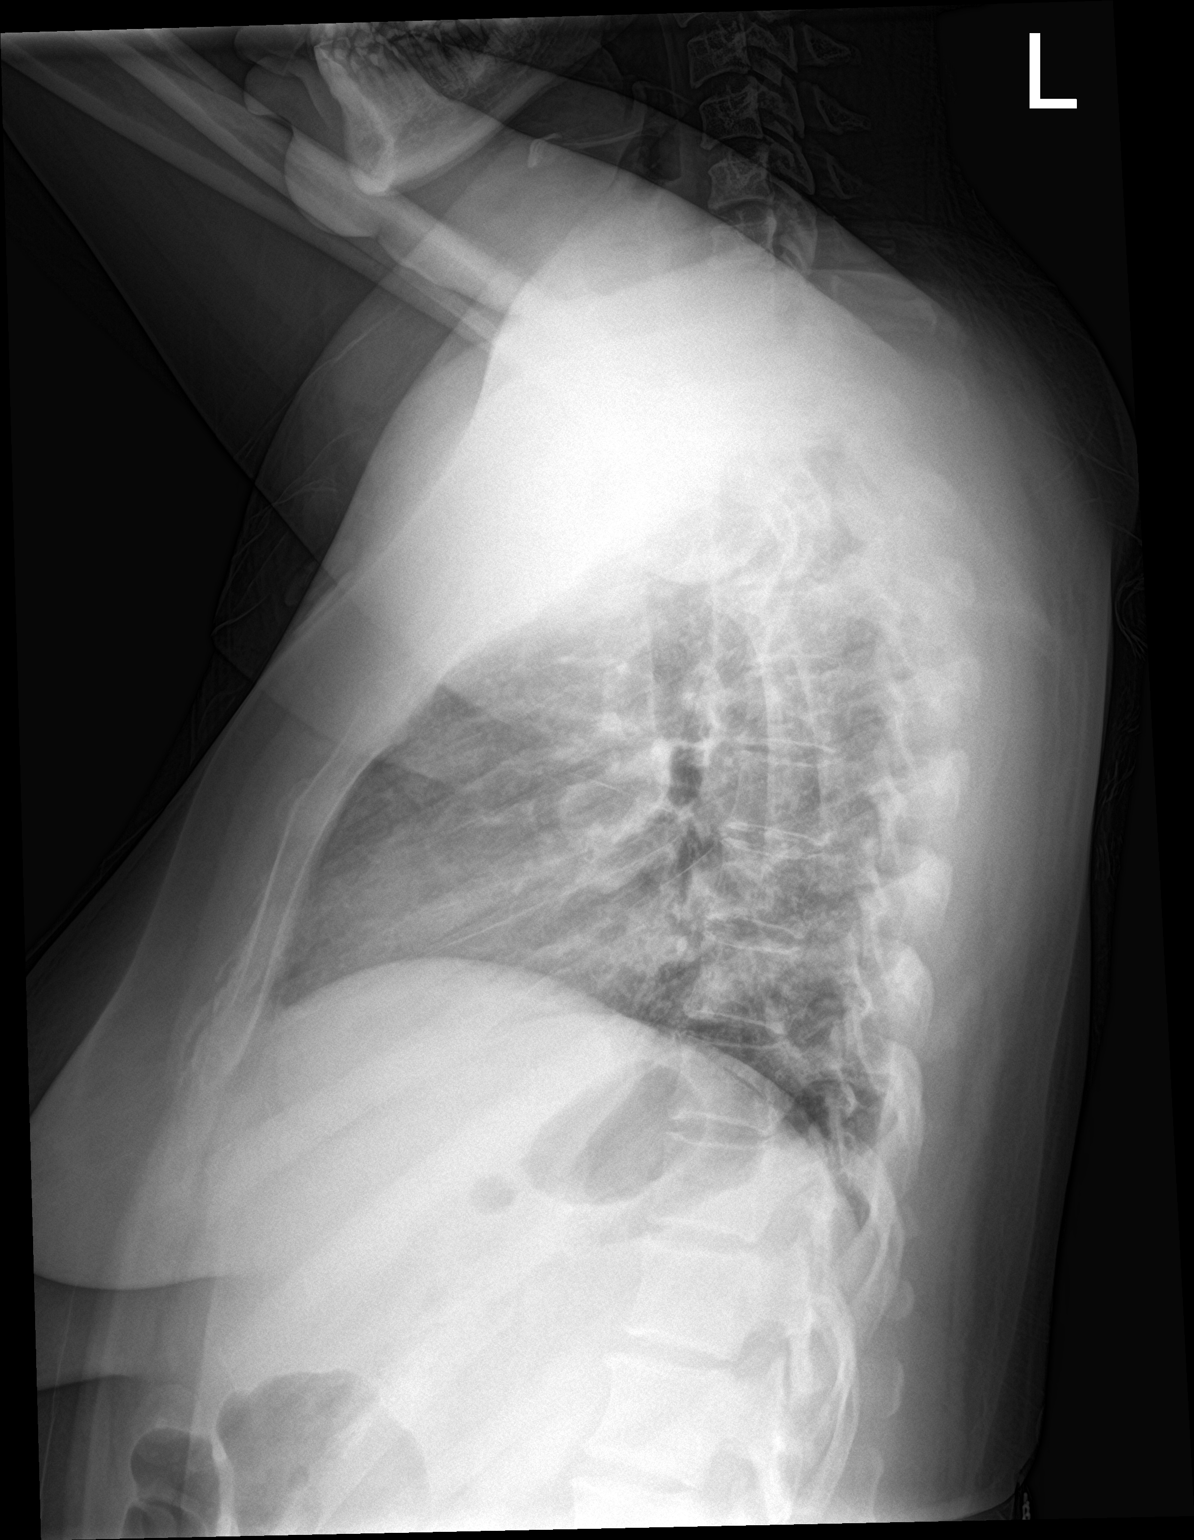

[2 of 2 positions shown; findings below may reference images not displayed]

FINDINGS: The heart size and mediastinal contours are within normal limits.
Both lungs are clear. The visualized skeletal structures are
unremarkable.
IMPRESSION: No acute abnormality of the lungs.

## 2021-02-26 IMAGING — US US OB TRANSVAGINAL
1 series · 15 of 27 positions shown · non-contrast
Comparison: Pelvic ultrasound dated 09/02/2019.

CLINICAL DATA: 38-year-old female with vaginal bleeding. Recent
pregnancy.

EXAM:
TRANSVAGINAL OB ULTRASOUND
TECHNIQUE: Transvaginal ultrasound was performed for complete evaluation of the
gestation as well as the maternal uterus, adnexal regions, and
pelvic cul-de-sac.

[Series 1: us ob transvaginal · 27 acquisitions, 15 frames shown]
[im 1/27]
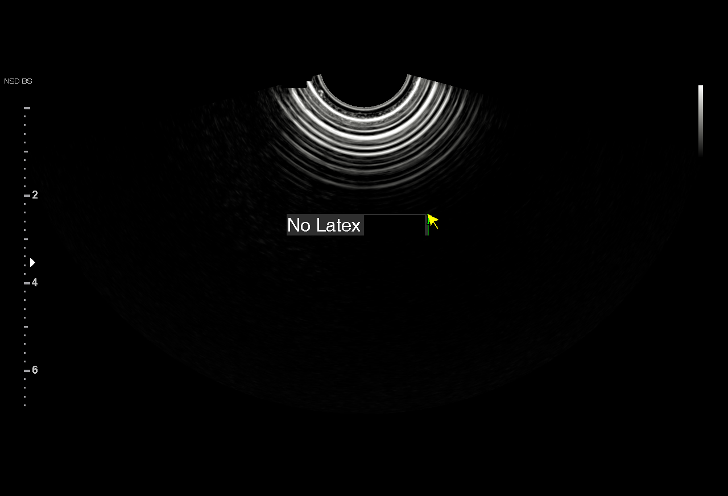
[im 3/27]
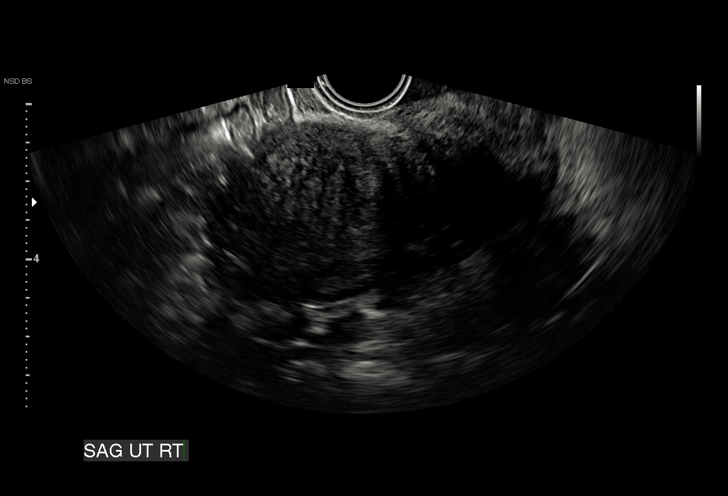
[im 5/27]
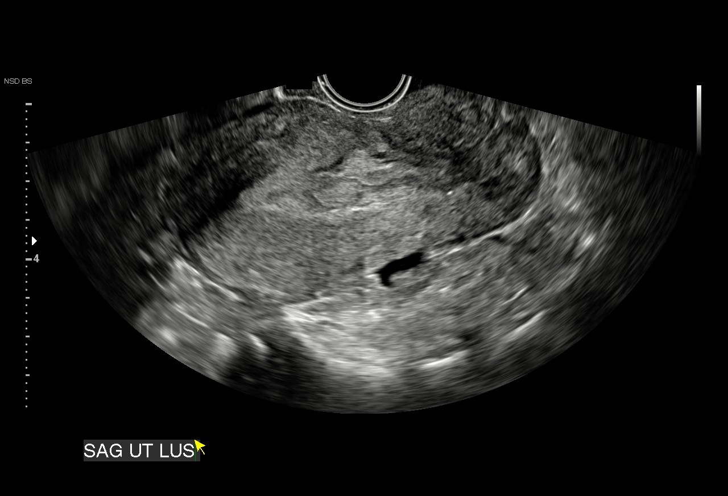
[im 7/27]
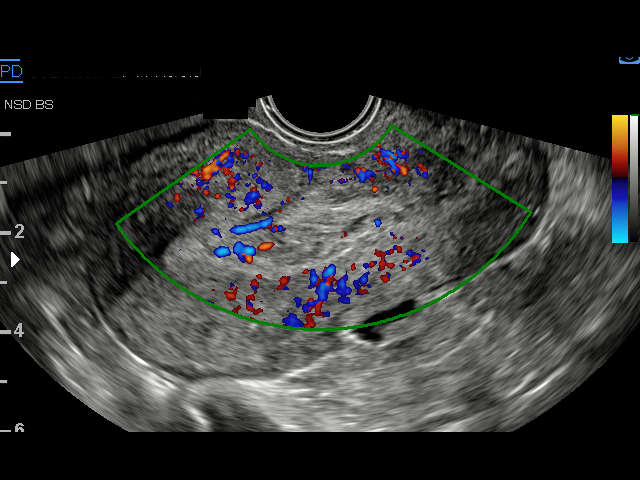
[im 9/27]
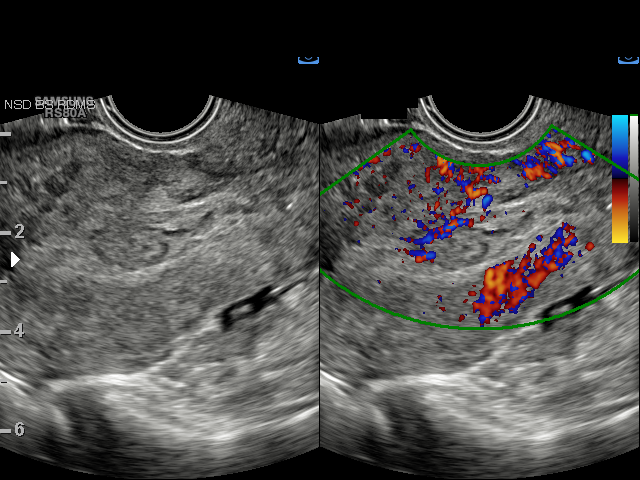
[im 10/27]
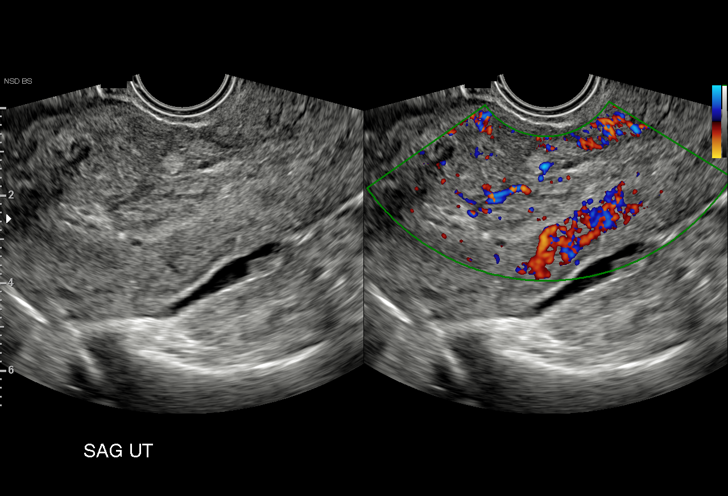
[im 12/27]
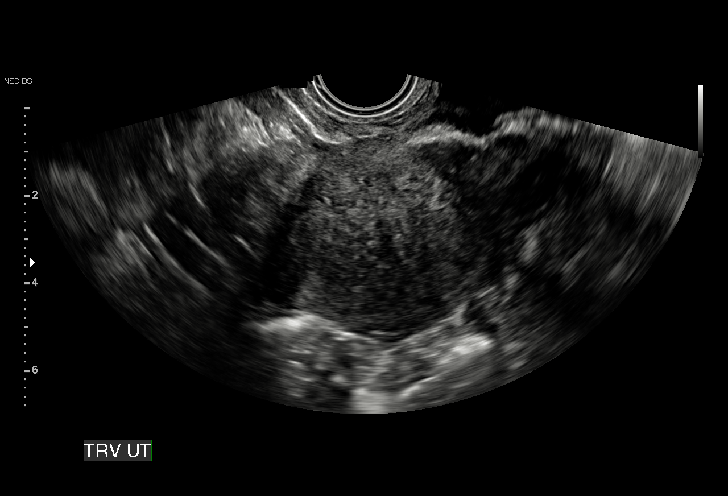
[im 14/27]
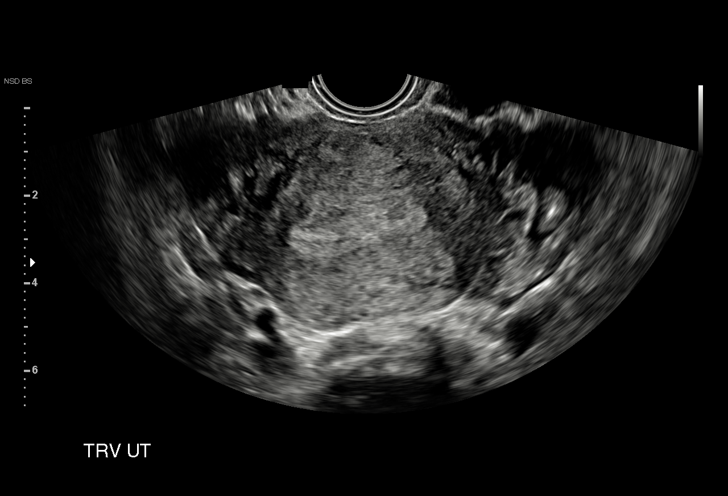
[im 16/27]
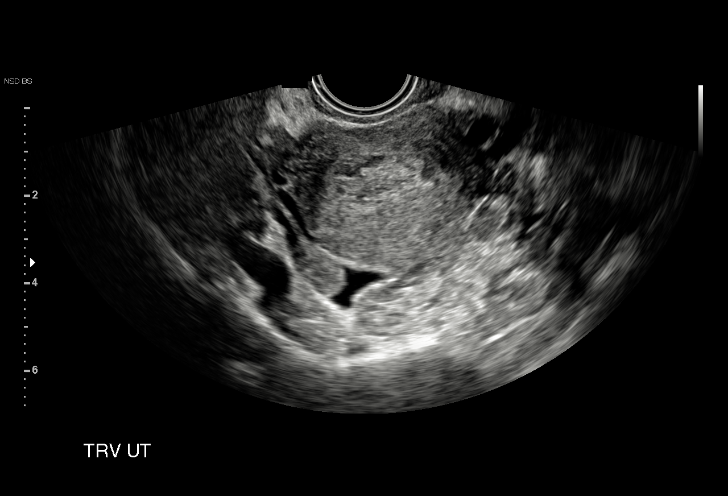
[im 18/27]
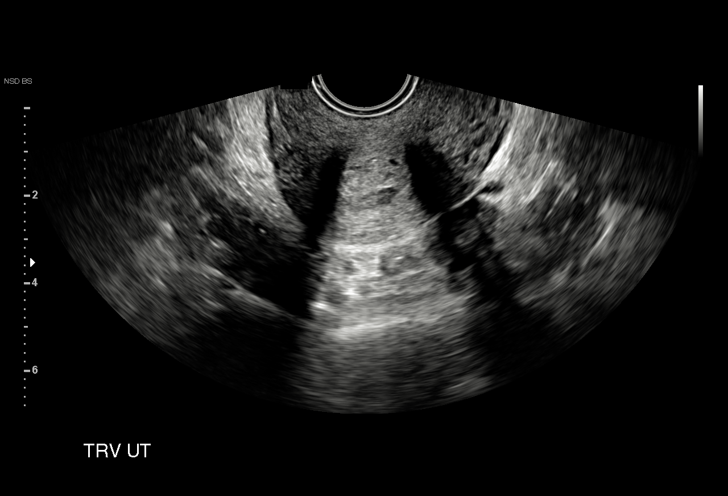
[im 19/27]
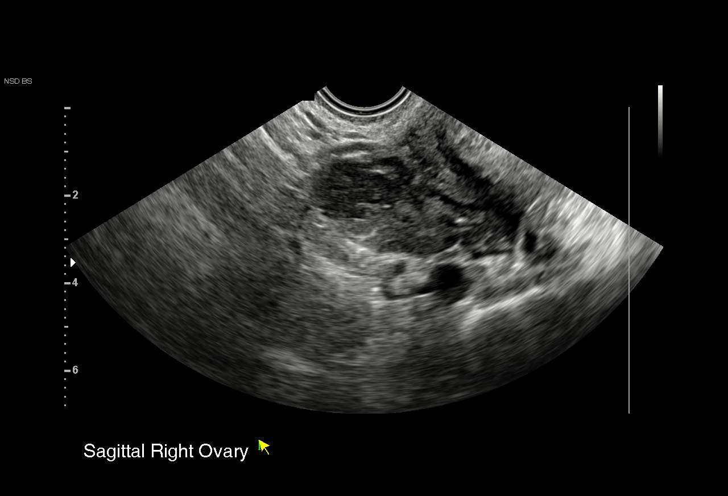
[im 21/27]
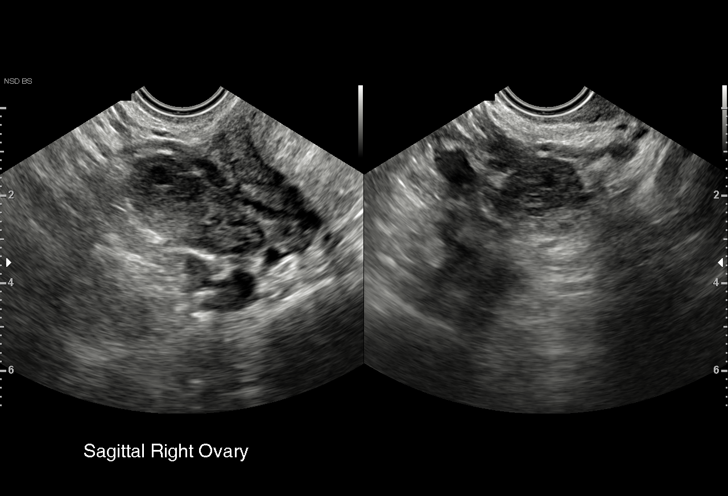
[im 23/27]
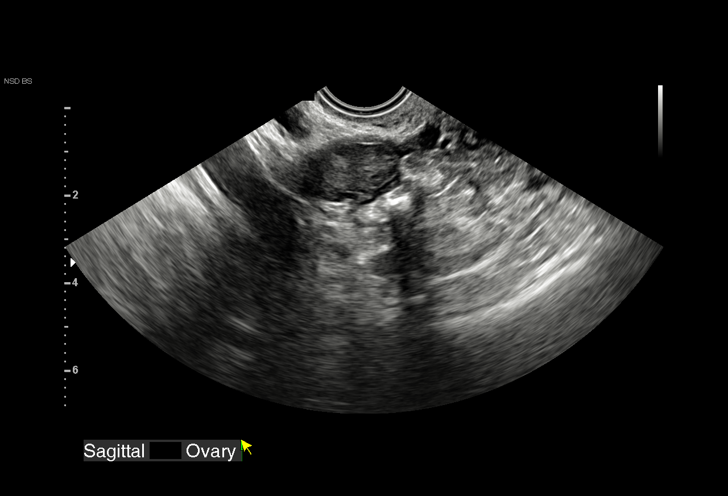
[im 25/27]
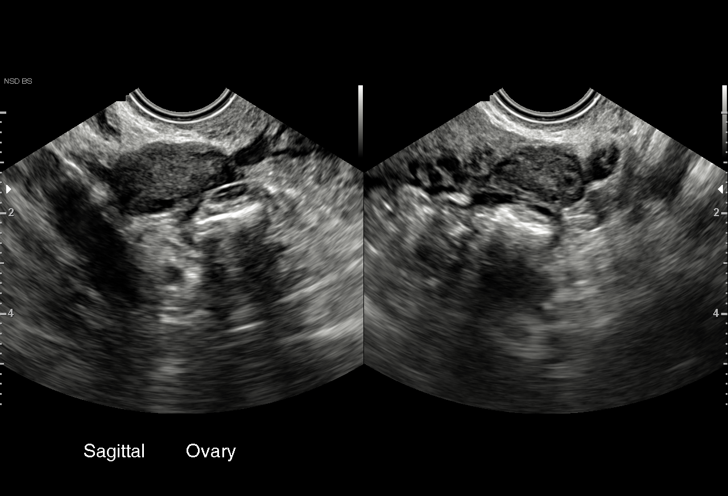
[im 27/27]
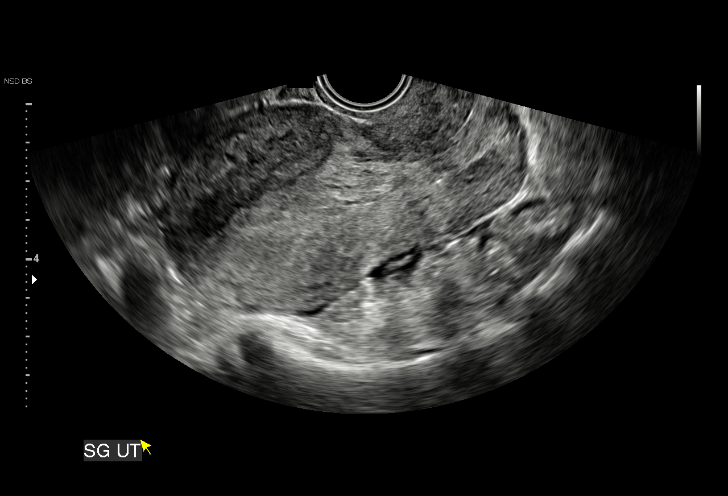

[15 of 27 positions shown; findings below may reference images not displayed]

FINDINGS: The uterus is anteverted and grossly unremarkable.

The endometrium is thickened with echogenic and heterogeneous
content measuring 18 mm. The previously seen intrauterine
gestational sac is no longer visualized in keeping with spontaneous
miscarriage.

The echogenic content within the endometrium most likely represent
blood products/clot. There is however increased endometrial
vascularity and therefore retained product of conception is not
excluded.

The ovaries are unremarkable.

No significant free fluid in the pelvis.
IMPRESSION: 1. Nonvisualization of the previously seen IUP in keeping with
recent spontaneous miscarriage.
2. Thickened endometrium with echogenic content which may represent
blood product or possible retained product of conception. Clinical
correlation and follow-up with serial HCG levels recommended.
3. Unremarkable ovaries.

## 2021-03-01 ENCOUNTER — Other Ambulatory Visit: Payer: Self-pay

## 2021-03-01 ENCOUNTER — Encounter (HOSPITAL_COMMUNITY): Payer: Self-pay | Admitting: *Deleted

## 2021-03-01 ENCOUNTER — Emergency Department (HOSPITAL_COMMUNITY): Payer: Self-pay

## 2021-03-01 ENCOUNTER — Emergency Department (HOSPITAL_COMMUNITY)
Admission: EM | Admit: 2021-03-01 | Discharge: 2021-03-01 | Disposition: A | Discharge status: Home / Self Care | Payer: Self-pay | Attending: Emergency Medicine | Admitting: Emergency Medicine

## 2021-03-01 DIAGNOSIS — R519 Headache, unspecified: Secondary | ICD-10-CM | POA: Insufficient documentation

## 2021-03-01 DIAGNOSIS — N9489 Other specified conditions associated with female genital organs and menstrual cycle: Secondary | ICD-10-CM | POA: Insufficient documentation

## 2021-03-01 DIAGNOSIS — H538 Other visual disturbances: Secondary | ICD-10-CM | POA: Insufficient documentation

## 2021-03-01 LAB — COMPREHENSIVE METABOLIC PANEL
ALT: 29 U/L (ref 0–44)
AST: 28 U/L (ref 15–41)
Albumin: 3.3 g/dL — ABNORMAL LOW (ref 3.5–5.0)
Alkaline Phosphatase: 76 U/L (ref 38–126)
Anion gap: 7 (ref 5–15)
BUN: 8 mg/dL (ref 6–20)
CO2: 25 mmol/L (ref 22–32)
Calcium: 8.4 mg/dL — ABNORMAL LOW (ref 8.9–10.3)
Chloride: 103 mmol/L (ref 98–111)
Creatinine, Ser: 0.49 mg/dL (ref 0.44–1.00)
GFR, Estimated: >60 mL/min (ref >60)
Glucose, Bld: 106 mg/dL — ABNORMAL HIGH (ref 70–99)
Potassium: 3.1 mmol/L — ABNORMAL LOW (ref 3.5–5.1)
Sodium: 135 mmol/L (ref 135–145)
Total Bilirubin: 0.4 mg/dL (ref 0.3–1.2)
Total Protein: 6.4 g/dL — ABNORMAL LOW (ref 6.5–8.1)

## 2021-03-01 LAB — CBC WITH DIFFERENTIAL/PLATELET
Abs Immature Granulocytes: 0.02 10*3/uL (ref 0.00–0.07)
Basophils Absolute: 0.1 10*3/uL (ref 0.0–0.1)
Basophils Relative: 1 %
Eosinophils Absolute: 0.2 10*3/uL (ref 0.0–0.5)
Eosinophils Relative: 3 %
HCT: 36.7 % (ref 36.0–46.0)
Hemoglobin: 11.7 g/dL — ABNORMAL LOW (ref 12.0–15.0)
Immature Granulocytes: 0 %
Lymphocytes Relative: 36 %
Lymphs Abs: 2.4 10*3/uL (ref 0.7–4.0)
MCH: 27.2 pg (ref 26.0–34.0)
MCHC: 31.9 g/dL (ref 30.0–36.0)
MCV: 85.3 fL (ref 80.0–100.0)
Monocytes Absolute: 0.7 10*3/uL (ref 0.1–1.0)
Monocytes Relative: 11 %
Neutro Abs: 3.2 10*3/uL (ref 1.7–7.7)
Neutrophils Relative %: 49 %
Platelets: 215 10*3/uL (ref 150–400)
RBC: 4.3 MIL/uL (ref 3.87–5.11)
RDW: 14.7 % (ref 11.5–15.5)
WBC: 6.6 10*3/uL (ref 4.0–10.5)
nRBC: 0 % (ref 0.0–0.2)

## 2021-03-01 LAB — I-STAT BETA HCG BLOOD, ED (MC, WL, AP ONLY): I-stat hCG, quantitative: <5 m[IU]/mL (ref <5)

## 2021-03-01 MED ORDER — KETOROLAC TROMETHAMINE 30 MG/ML IJ SOLN
30.0000 mg | Freq: Once | INTRAMUSCULAR | Status: AC
  Administered 2021-03-01: 30 mg via INTRAMUSCULAR
  Filled 2021-03-01: qty 1

## 2021-03-01 MED ORDER — METOCLOPRAMIDE HCL 10 MG PO TABS: 10.0000 mg | ORAL_TABLET | Freq: Four times a day (QID) | ORAL | 0 refills | Status: DC | PRN

## 2021-03-01 MED ORDER — POTASSIUM CHLORIDE CRYS ER 20 MEQ PO TBCR
40.0000 meq | EXTENDED_RELEASE_TABLET | Freq: Once | ORAL | Status: AC
  Administered 2021-03-01: 40 meq via ORAL
  Filled 2021-03-01: qty 2

## 2021-03-01 MED ORDER — METOCLOPRAMIDE HCL 5 MG/ML IJ SOLN
10.0000 mg | Freq: Once | INTRAMUSCULAR | Status: AC
  Administered 2021-03-01: 10 mg via INTRAMUSCULAR
  Filled 2021-03-01: qty 2

## 2021-03-01 NOTE — ED Provider Notes (Signed)
[facility] EMERGENCY DEPARTMENT Provider Note   CSN: [id number] Arrival date & time: 03/01/21  1539     History Chief Complaint  Patient presents with   Headache    Kathryn Glenn is a 40 y.o. female who presented with headaches.  Patient has been having frontal headaches for the last 4 days.  Patient states that it is associated with some blurry vision.  Patient denies any neck pain or fevers.  Patient went to urgent care was sent here for blood work and possible CT head.  Patient usually does not have headaches  The history is provided by the patient.      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   S/P cesarean section 08/13/2012   Previous classical cesarean delivery, antepartum condition or complication 04/22/2012   Supervision of high-risk pregnancy 04/08/2012   History of preterm delivery, currently pregnant 04/05/2012    Past Surgical History:  Procedure Laterality Date   APPENDECTOMY     CESAREAN SECTION     C/S x 1   CESAREAN SECTION N/A 08/10/2012   Procedure: CESAREAN SECTION;  Surgeon: Catalina Antigua, MD;  Location: [facility];  Service: Obstetrics;  Laterality: N/A;  Repeat     OB History     Gravida  6   Para  4   Term  3   Preterm  1   AB  1   Living  4      SAB      IAB  1   Ectopic      Multiple      Live Births  4           Family History  Problem Relation Age of Onset   Heart disease Mother     Social History   Tobacco Use   Smoking status: Never   Smokeless tobacco: Never  Substance Use Topics   Alcohol use: No   Drug use: No    Home Medications Prior to Admission medications   Not on File    Allergies    Patient has no known allergies.  Review of Systems   Review of Systems  Neurological:  Positive for  headaches.  All other systems reviewed and are negative.  Physical Exam Updated Vital Signs BP (!) 149/82 (BP Location: Right Arm)   Pulse 66   Temp 97.9 F (36.6 C)   Resp 18   Ht 4\' 9"  (1.448 m)   Wt 65.7 kg   LMP 02/15/2021   SpO2 100%   BMI 31.34 kg/m   Physical Exam Vitals and nursing note reviewed.  Constitutional:      Comments: Slightly uncomfortable   HENT:     Head: Normocephalic.     Mouth/Throat:     Mouth: Mucous membranes are moist.  Eyes:     General: No visual field deficit.    Extraocular Movements: Extraocular movements intact.     Pupils: Pupils are equal, round, and reactive to light.  Cardiovascular:     Rate and Rhythm: Normal rate and regular rhythm.     Heart sounds: Normal heart sounds.  Pulmonary:     Effort: Pulmonary effort is normal.     Breath sounds: Normal breath sounds.  Abdominal:     General: Bowel sounds  are normal.     Palpations: Abdomen is soft.  Musculoskeletal:        General: Normal range of motion.     Cervical back: Normal range of motion and neck supple.  Skin:    General: Skin is warm.  Neurological:     Mental Status: She is alert and oriented to person, place, and time.     Cranial Nerves: No cranial nerve deficit or facial asymmetry.     Sensory: No sensory deficit.  Psychiatric:        Mood and Affect: Mood normal.        Behavior: Behavior normal.    ED Results / Procedures / Treatments   Labs (all labs ordered are listed, but only abnormal results are displayed) Labs Reviewed  COMPREHENSIVE METABOLIC PANEL - Abnormal; Notable for the following components:      Result Value   Potassium 3.1 (*)    Glucose, Bld 106 (*)    Calcium 8.4 (*)    Total Protein 6.4 (*)    Albumin 3.3 (*)    All other components within normal limits  CBC WITH DIFFERENTIAL/PLATELET - Abnormal; Notable for the following components:   Hemoglobin 11.7 (*)    All other components within normal limits  I-STAT BETA HCG BLOOD, ED ([facility],  [facility], [facility] ONLY)    EKG None  Radiology CT HEAD WO CONTRAST ( )  Result Date: 03/01/2021 CLINICAL DATA:  Headache. EXAM: CT HEAD WITHOUT CONTRAST TECHNIQUE: Contiguous axial images were obtained from the base of the skull through the vertex without intravenous contrast. COMPARISON:  None. FINDINGS: Brain: No evidence of acute infarction, hemorrhage, hydrocephalus, extra-axial collection or mass lesion/mass effect. Vascular: No hyperdense vessel or unexpected calcification. Skull: Normal. Negative for fracture or focal lesion. Sinuses/Orbits: No acute finding. Other: None. IMPRESSION: No acute intracranial abnormality. Electronically Signed   By: Darliss Cheney M.D.   On: 03/01/2021 19:03    Procedures Procedures   Medications Ordered in ED Medications  metoCLOPramide (REGLAN) injection 10 mg (10 mg Intramuscular Given 03/01/21 2105)  ketorolac (TORADOL) 30 MG/ML injection 30 mg (30 mg Intramuscular Given 03/01/21 2105)  potassium chloride SA (KLOR-CON) CR tablet 40 mEq (40 mEq Oral Given 03/01/21 2105)    ED Course  I have reviewed the triage vital signs and the nursing notes.  Pertinent labs & imaging results that were available during my care of the patient were reviewed by me and considered in my medical decision making (see chart for details).    MDM Rules/Calculators/A&P                          Kathryn Glenn is a 40 y.o. female who presented with headache.  This is a new headache for her.  Patient is neurologically intact.  CT head is unremarkable.  Labs unremarkable.  Given migraine cocktail and felt better.  Stable for discharge.    Final Clinical Impression(s) / ED Diagnoses Final diagnoses:  None    Rx / DC Orders ED Discharge Orders     None        Charlynne Pander, MD 03/01/21 2149

## 2021-03-01 NOTE — ED Provider Notes (Signed)
Emergency Medicine Provider Triage Evaluation Note  Kathryn Glenn , a 40 y.o. female  was evaluated in triage.  Pt complains of headache and blurry vision for the past four days.  No difficulty speaking or walking. She has been nauseated. She took tylenol yesterday with out relief.   Review of Systems  Positive: Nose pain, headache, nausea Negative: Fever  Physical Exam  BP (!) 149/82 (BP Location: Right Arm)   Pulse 66   Temp 97.9 F (36.6 C)   Resp 18   Ht 4\' 9"  (1.448 m)   Wt 65.7 kg   LMP 02/15/2021   SpO2 100%   BMI 31.34 kg/m  Gen:   Awake, no distress   Resp:  Normal effort  MSK:   Moves extremities without difficulty  Other:  Appears anxious, speech is not slurred.  Moves all 4 extremities with out difficulty.   Medical Decision Making  Medically screening exam initiated at 5:42 PM.  Appropriate orders placed.  Wilson Kadi Hession was informed that the remainder of the evaluation will be completed by another provider, this initial triage assessment does not replace that evaluation, and the importance of remaining in the ED until their evaluation is complete.  Patient presents today for evaluation of 4 days of ongoing headache, unrelieved by Tylenol, she is hypertensive here, does not clearly have a history of hypertension.  She also reports a slight vision changes that she describes as things are slightly blurry, not doubled.  Interactions with patient performed through professional Spanish-speaking medical interpreter.  Will obtain CT scan of head, along with basic labs.  Note: Portions of this report may have been transcribed using voice recognition software. Every effort was made to ensure accuracy; however, inadvertent computerized transcription errors may be present    Parks Ranger, PA-C 03/01/21 1749    05/01/21, MD 03/01/21 [phone]

## 2021-03-01 NOTE — ED Triage Notes (Signed)
The pt is c/o a headache with nausea nausea and dizziness for 4 days symptoms worse today  she speaks spanish   lmp 2 weeks ago

## 2021-03-01 NOTE — Discharge Instructions (Addendum)
Take tylenol, motrin for headaches   Take reglan for nausea   See neurologist for follow up   Return to ER if you have worse headaches, vomiting, fever

## 2021-03-01 NOTE — ED Notes (Signed)
PT left before discharge vs and discharge papers could be given.  Called and left message via interpreter services and left discharge paperwork, with prescription, at the front desk.

## 2022-06-23 NOTE — L&D Delivery Note (Signed)
OB/GYN Faculty Practice Delivery Note  Kathryn Glenn is a 42 y.o. [id number] s/p SVD at [redacted]w[redacted]d. She was admitted for IUFD.   ROM: delivered en caul GBS Status: Unknown   Maximum Maternal Temperature: 98.35F  Labor Progress: Single dose of Cytotec given, called to the room for protruding bag of waters. Intermittent mild cramping continued, 2nd dose of Cytotec given 4hrs after the first. Maternal urge to push.  Delivery Date/Time: 04/09/23 @0028   Delivery: Called to room and patient with bulging bag of water extending out of the introitus. Baby delivered en caul. Offered mother and father to view and/or hold infant, they declined. En caul fetus removed from labor room. Fundus firm with massage, Pitocin and single fundal sweep to removed retained placental fragments and clots.  Labia, perineum, vagina, and cervix inspected without injury.   Baby Weight: 301g  Placenta: 3 vessel, intact. Sent to L&D Complications: None Lacerations: None EBL: 175 mL Analgesia: Epidural   Infant: Deceased, to L&D patents remain unsure of plans for the remains.   Wyn Forster, MD [facility] Family Medicine Fellow, [facility] for [facility], [facility] 04/09/2023, 2:46 AM

## 2022-12-10 ENCOUNTER — Encounter (HOSPITAL_COMMUNITY): Payer: Self-pay | Admitting: *Deleted

## 2022-12-10 ENCOUNTER — Inpatient Hospital Stay (HOSPITAL_COMMUNITY): Payer: Self-pay

## 2022-12-10 ENCOUNTER — Inpatient Hospital Stay (HOSPITAL_COMMUNITY)
Admission: AD | Admit: 2022-12-10 | Discharge: 2022-12-10 | Disposition: A | Discharge status: Home / Self Care | Payer: Self-pay | Attending: Obstetrics and Gynecology | Admitting: Obstetrics and Gynecology

## 2022-12-10 DIAGNOSIS — O209 Hemorrhage in early pregnancy, unspecified: Secondary | ICD-10-CM | POA: Insufficient documentation

## 2022-12-10 DIAGNOSIS — Z3A11 11 weeks gestation of pregnancy: Secondary | ICD-10-CM | POA: Insufficient documentation

## 2022-12-10 DIAGNOSIS — O26891 Other specified pregnancy related conditions, first trimester: Secondary | ICD-10-CM | POA: Insufficient documentation

## 2022-12-10 DIAGNOSIS — O09521 Supervision of elderly multigravida, first trimester: Secondary | ICD-10-CM | POA: Insufficient documentation

## 2022-12-10 HISTORY — DX: Depression, unspecified: F32.A

## 2022-12-10 LAB — COMPREHENSIVE METABOLIC PANEL
ALT: 30 U/L (ref 0–44)
AST: 28 U/L (ref 15–41)
Albumin: 3.4 g/dL — ABNORMAL LOW (ref 3.5–5.0)
Alkaline Phosphatase: 62 U/L (ref 38–126)
Anion gap: 8 (ref 5–15)
BUN: 10 mg/dL (ref 6–20)
CO2: 20 mmol/L — ABNORMAL LOW (ref 22–32)
Calcium: 8.4 mg/dL — ABNORMAL LOW (ref 8.9–10.3)
Chloride: 105 mmol/L (ref 98–111)
Creatinine, Ser: 0.48 mg/dL (ref 0.44–1.00)
GFR, Estimated: >60 mL/min (ref >60)
Glucose, Bld: 104 mg/dL — ABNORMAL HIGH (ref 70–99)
Potassium: 3.4 mmol/L — ABNORMAL LOW (ref 3.5–5.1)
Sodium: 133 mmol/L — ABNORMAL LOW (ref 135–145)
Total Bilirubin: 0.7 mg/dL (ref 0.3–1.2)
Total Protein: 6.6 g/dL (ref 6.5–8.1)

## 2022-12-10 LAB — URINALYSIS, ROUTINE W REFLEX MICROSCOPIC
Bilirubin Urine: NEGATIVE
Glucose, UA: NEGATIVE mg/dL
Hgb urine dipstick: NEGATIVE
Ketones, ur: NEGATIVE mg/dL
Leukocytes,Ua: NEGATIVE
Nitrite: NEGATIVE
Protein, ur: NEGATIVE mg/dL
Specific Gravity, Urine: 1.018 (ref 1.005–1.030)
pH: 6 (ref 5.0–8.0)

## 2022-12-10 LAB — CBC
HCT: 34.7 % — ABNORMAL LOW (ref 36.0–46.0)
Hemoglobin: 10.9 g/dL — ABNORMAL LOW (ref 12.0–15.0)
MCH: 24.4 pg — ABNORMAL LOW (ref 26.0–34.0)
MCHC: 31.4 g/dL (ref 30.0–36.0)
MCV: 77.6 fL — ABNORMAL LOW (ref 80.0–100.0)
Platelets: 199 10*3/uL (ref 150–400)
RBC: 4.47 MIL/uL (ref 3.87–5.11)
RDW: 18.6 % — ABNORMAL HIGH (ref 11.5–15.5)
WBC: 7.2 10*3/uL (ref 4.0–10.5)
nRBC: 0 % (ref 0.0–0.2)

## 2022-12-10 LAB — WET PREP, GENITAL
Clue Cells Wet Prep HPF POC: NONE SEEN
Sperm: NONE SEEN
Trich, Wet Prep: NONE SEEN
WBC, Wet Prep HPF POC: <10 (ref <10)
Yeast Wet Prep HPF POC: NONE SEEN

## 2022-12-10 LAB — HCG, QUANTITATIVE, PREGNANCY: hCG, Beta Chain, Quant, S: 45870 m[IU]/mL — ABNORMAL HIGH (ref <5)

## 2022-12-10 NOTE — MAU Note (Signed)
Kathryn Glenn is a 42 y.o. at 11wks,  here in MAU reporting: due in January, going to the health dept.  Started spotting yesterday, bright red, no clots. No recent intercourse.  Started cramping last night.  Hx of SAB.  Has not had [place] yet, first appt was last wk  Onset of complaint: yesterday Pain score: 5 Vitals:   12/10/22 0709  BP: 132/74  Pulse: 65  Resp: 17  Temp: 98 F (36.7 C)  SpO2: 99%     FHT: Lab orders placed from triage:

## 2022-12-10 NOTE — MAU Provider Note (Signed)
History     [id number]  Arrival date and time: 12/10/22 8295    Chief Complaint  Patient presents with   Vaginal Bleeding   Abdominal Pain     HPI Kathryn Glenn is a 42 y.o. at [redacted]w[redacted]d with PMHx notable for AMA, CS x2, who presents for vaginal bleeding.   Patient reports that last night she had some bright red bleeding, mostly spotting This morning woke up with the same and was worried Some light cramping as well Worried because she had similar symptoms with a miscarriage in 2021  Blood type O+ on prior testing    OB History     Gravida  7   Para  4   Term  3   Preterm  1   AB  2   Living  4      SAB      IAB  1   Ectopic  1   Multiple      Live Births  4        Obstetric Comments  2003 does not know what happened, "was in a deep depression, was not getting care"         Past Medical History:  Diagnosis Date   Depression    No pertinent past medical history    Preterm labor     Past Surgical History:  Procedure Laterality Date   APPENDECTOMY     CESAREAN SECTION     C/S x 1   CESAREAN SECTION N/A 08/10/2012   Procedure: CESAREAN SECTION;  Surgeon: Catalina Antigua, MD;  Location: [facility];  Service: Obstetrics;  Laterality: N/A;  Repeat    Family History  Problem Relation Age of Onset   Stroke Mother        lost her vision   Healthy Father     Social History   Socioeconomic History   Marital status: Single    Spouse name: Not on file   Number of children: Not on file   Years of education: Not on file   Highest education level: Not on file  Occupational History   Not on file  Tobacco Use   Smoking status: Never   Smokeless tobacco: Never  Vaping Use   Vaping Use: Never used  Substance and Sexual Activity   Alcohol use: No   Drug use: No   Sexual activity: Not Currently    Birth control/protection: None  Other Topics Concern   Not on file  Social History Narrative   Not on file   Social Determinants of Health    Financial Resource Strain: Not on file  Food Insecurity: Not on file  Transportation Needs: Not on file  Physical Activity: Not on file  Stress: Not on file  Social Connections: Not on file  Intimate Partner Violence: Not on file    No Known Allergies  Current Facility-Administered Medications on File Prior to Encounter  Medication Dose Route Frequency Provider Last Rate Last Admin   TDaP (BOOSTRIX) injection 0.5 mL  0.5 mL Intramuscular Once Bronson Curb, Walidah N, CNM       Current Outpatient Medications on File Prior to Encounter  Medication Sig Dispense Refill   Prenatal Vit-Fe Fumarate-FA (MULTIVITAMIN-PRENATAL) 27-0.8 MG TABS tablet Take 1 tablet by mouth daily at 12 noon.     metoCLOPramide (REGLAN) 10 MG tablet Take 1 tablet (10 mg total) by mouth every 6 (six) hours as needed for nausea (nausea/headache). 10 tablet 0     ROS Pertinent positives and  negative per HPI, all others reviewed and negative  Physical Exam   BP 132/74 (BP Location: Right Arm)   Pulse 65   Temp 98 F (36.7 C) (Oral)   Resp 17   Ht 4\' 9"  (1.448 m)   Wt 69.7 kg   SpO2 99%   BMI 33.24 kg/m   Patient Vitals for the past 24 hrs:  BP Temp Temp src Pulse Resp SpO2 Height Weight  12/10/22 0709 132/74 98 F (36.7 C) Oral 65 17 99 % -- --  12/10/22 0703 -- -- -- -- -- -- 4\' 9"  (1.448 m) 69.7 kg    Physical Exam Vitals reviewed.  Constitutional:      General: She is not in acute distress.    Appearance: She is well-developed. She is not diaphoretic.  Eyes:     General: No scleral icterus. Pulmonary:     Effort: Pulmonary effort is normal. No respiratory distress.  Skin:    General: Skin is warm and dry.  Neurological:     Mental Status: She is alert.     Coordination: Coordination normal.      Cervical Exam    Bedside Ultrasound Not performed.  My interpretation: n/a  Labs Results for orders placed or performed during the hospital encounter of 12/10/22 (from the past 24  hour(s))  Urinalysis, Routine w reflex microscopic -Urine, Clean Catch     Status: None   Collection Time: 12/10/22  7:30 AM  Result Value Ref Range   Color, Urine YELLOW YELLOW   APPearance CLEAR CLEAR   Specific Gravity, Urine 1.018 1.005 - 1.030   pH 6.0 5.0 - 8.0   Glucose, UA NEGATIVE NEGATIVE mg/dL   Hgb urine dipstick NEGATIVE NEGATIVE   Bilirubin Urine NEGATIVE NEGATIVE   Ketones, ur NEGATIVE NEGATIVE mg/dL   Protein, ur NEGATIVE NEGATIVE mg/dL   Nitrite NEGATIVE NEGATIVE   Leukocytes,Ua NEGATIVE NEGATIVE  Wet prep, genital     Status: None   Collection Time: 12/10/22  7:31 AM   Specimen: Vaginal  Result Value Ref Range   Yeast Wet Prep HPF POC NONE SEEN NONE SEEN   Trich, Wet Prep NONE SEEN NONE SEEN   Clue Cells Wet Prep HPF POC NONE SEEN NONE SEEN   WBC, Wet Prep HPF POC <10 <10   Sperm NONE SEEN   CBC     Status: Abnormal   Collection Time: 12/10/22  7:40 AM  Result Value Ref Range   WBC 7.2 4.0 - 10.5 K/uL   RBC 4.47 3.87 - 5.11 MIL/uL   Hemoglobin 10.9 (L) 12.0 - 15.0 g/dL   HCT 16.1 (L) 09.6 - 04.5 %   MCV 77.6 (L) 80.0 - 100.0 fL   MCH 24.4 (L) 26.0 - 34.0 pg   MCHC 31.4 30.0 - 36.0 g/dL   RDW 40.9 (H) 81.1 - 91.4 %   Platelets 199 150 - 400 K/uL   nRBC 0.0 0.0 - 0.2 %  Comprehensive metabolic panel     Status: Abnormal   Collection Time: 12/10/22  7:40 AM  Result Value Ref Range   Sodium 133 (L) 135 - 145 mmol/L   Potassium 3.4 (L) 3.5 - 5.1 mmol/L   Chloride 105 98 - 111 mmol/L   CO2 20 (L) 22 - 32 mmol/L   Glucose, Bld 104 (H) 70 - 99 mg/dL   BUN 10 6 - 20 mg/dL   Creatinine, Ser 7.82 0.44 - 1.00 mg/dL   Calcium 8.4 (L) 8.9 - 10.3 mg/dL  Total Protein 6.6 6.5 - 8.1 g/dL   Albumin 3.4 (L) 3.5 - 5.0 g/dL   AST 28 15 - 41 U/L   ALT 30 0 - 44 U/L   Alkaline Phosphatase 62 38 - 126 U/L   Total Bilirubin 0.7 0.3 - 1.2 mg/dL   GFR, Estimated >40 >98 mL/min   Anion gap 8 5 - 15    Imaging No results found.  MAU Course  Procedures Lab Orders          Wet prep, genital         CBC         Comprehensive metabolic panel         hCG, quantitative, pregnancy         Urinalysis, Routine w reflex microscopic -Urine, Clean Catch    No orders of the defined types were placed in this encounter.  Imaging Orders         US OB LESS THAN 14 WEEKS WITH OB TRANSVAGINAL     MDM Moderate (Level 3-4)  Assessment and Plan  #Vaginal bleeding in pregnancy, first trimester #[redacted] weeks gestation of pregnancy US shows viable IUP with normal FHR. We discussed that vaginal bleeding in the first trimester is common, and that 80-90% of patients will go on to have a normal pregnancy with a live delivery. The remainder are at increased risk for miscarriage, unfortunately there are no known interventions to mitigate this risk. Blood type O+, rhogam not indicated. We discussed return precautions including crescendo abdominal pain, heavy vaginal bleeding soaking >1 pad/hour, and fever.    Dispo: discharged to home in stable condition    Venora Maples, MD/MPH 12/10/22 8:30 AM  Allergies as of 12/10/2022   No Known Allergies      Medication List     TAKE these medications    metoCLOPramide 10 MG tablet Commonly known as: Reglan Take 1 tablet (10 mg total) by mouth every 6 (six) hours as needed for nausea (nausea/headache).   multivitamin-prenatal 27-0.8 MG Tabs tablet Take 1 tablet by mouth daily at 12 noon.

## 2022-12-11 LAB — GC/CHLAMYDIA PROBE AMP (~~LOC~~) NOT AT ARMC
Chlamydia: NEGATIVE
Comment: NEGATIVE
Comment: NORMAL
Neisseria Gonorrhea: NEGATIVE

## 2023-01-22 ENCOUNTER — Inpatient Hospital Stay (HOSPITAL_COMMUNITY)
Admission: AD | Admit: 2023-01-22 | Discharge: 2023-01-23 | Disposition: A | Discharge status: Home / Self Care | Payer: Self-pay | Attending: Obstetrics and Gynecology | Admitting: Obstetrics and Gynecology

## 2023-01-22 DIAGNOSIS — O209 Hemorrhage in early pregnancy, unspecified: Secondary | ICD-10-CM | POA: Insufficient documentation

## 2023-01-22 DIAGNOSIS — O26899 Other specified pregnancy related conditions, unspecified trimester: Secondary | ICD-10-CM

## 2023-01-22 DIAGNOSIS — O26891 Other specified pregnancy related conditions, first trimester: Secondary | ICD-10-CM | POA: Insufficient documentation

## 2023-01-22 DIAGNOSIS — Z3A13 13 weeks gestation of pregnancy: Secondary | ICD-10-CM | POA: Insufficient documentation

## 2023-01-22 DIAGNOSIS — O09521 Supervision of elderly multigravida, first trimester: Secondary | ICD-10-CM | POA: Insufficient documentation

## 2023-01-23 ENCOUNTER — Inpatient Hospital Stay (HOSPITAL_COMMUNITY): Payer: Self-pay

## 2023-01-23 DIAGNOSIS — O26891 Other specified pregnancy related conditions, first trimester: Secondary | ICD-10-CM

## 2023-01-23 DIAGNOSIS — R109 Unspecified abdominal pain: Secondary | ICD-10-CM

## 2023-01-23 DIAGNOSIS — O209 Hemorrhage in early pregnancy, unspecified: Secondary | ICD-10-CM

## 2023-01-23 DIAGNOSIS — Z3A13 13 weeks gestation of pregnancy: Secondary | ICD-10-CM

## 2023-01-23 LAB — URINALYSIS, ROUTINE W REFLEX MICROSCOPIC
Bilirubin Urine: NEGATIVE
Glucose, UA: NEGATIVE mg/dL
Hgb urine dipstick: NEGATIVE
Ketones, ur: NEGATIVE mg/dL
Leukocytes,Ua: NEGATIVE
Nitrite: NEGATIVE
Protein, ur: NEGATIVE mg/dL
Specific Gravity, Urine: 1.014 (ref 1.005–1.030)
pH: 6 (ref 5.0–8.0)

## 2023-01-23 MED ORDER — LABETALOL HCL 200 MG PO TABS: 200.0000 mg | ORAL_TABLET | Freq: Two times a day (BID) | ORAL | 0 refills | Status: DC

## 2023-01-23 NOTE — MAU Provider Note (Signed)
Chief Complaint: Abdominal Pain and Vaginal Bleeding   Event Date/Time   First Provider Initiated Contact with Patient 01/23/23 0148        SUBJECTIVE HPI: Kathryn Glenn is a 42 y.o. [id number] at [redacted]w[redacted]d by LMP who presents to maternity admissions reporting bleeding and cramping.  States bleeding increased this week.  Was seen in June for this and US showed viable single IUP.  Has been to HD once for pregnancy test.  No new OB yet. . She denies urinary symptoms, h/a, dizziness, n/v, or fever/chills.     Abdominal Pain This is a recurrent problem. The pain is located in the suprapubic region. The quality of the pain is cramping. Pertinent negatives include no constipation, diarrhea, dysuria, fever or frequency. Nothing aggravates the pain. The pain is relieved by Nothing. She has tried nothing for the symptoms.  Vaginal Bleeding The patient's primary symptoms include pelvic pain and vaginal bleeding. The patient's pertinent negatives include no genital itching or genital odor. She is pregnant. Associated symptoms include abdominal pain. Pertinent negatives include no constipation, diarrhea, dysuria, fever or frequency. The vaginal discharge was bloody. The vaginal bleeding is lighter than menses. She has been passing clots. She has not been passing tissue. Nothing aggravates the symptoms. She has tried nothing for the symptoms.   RN Note: Kathryn Glenn is a 42 y.o. at [redacted]w[redacted]d here in MAU reporting vag bleeding the entire preganancy. Bleeding more for the past 2months. Since yesterday she has had alittle heavier bleeding with small clots. Also reports abdominal pain that is like period-like cramping   Past Medical History:  Diagnosis Date   Depression    No pertinent past medical history    Preterm labor    Past Surgical History:  Procedure Laterality Date   APPENDECTOMY     CESAREAN SECTION     C/S x 1   CESAREAN SECTION N/A 08/10/2012   Procedure: CESAREAN SECTION;  Surgeon:  Catalina Antigua, MD;  Location: [facility];  Service: Obstetrics;  Laterality: N/A;  Repeat   Social History   Socioeconomic History   Marital status: Single    Spouse name: Not on file   Number of children: Not on file   Years of education: Not on file   Highest education level: Not on file  Occupational History   Not on file  Tobacco Use   Smoking status: Never   Smokeless tobacco: Never  Vaping Use   Vaping status: Never Used  Substance and Sexual Activity   Alcohol use: No   Drug use: No   Sexual activity: Not Currently    Birth control/protection: None  Other Topics Concern   Not on file  Social History Narrative   Not on file   Social Determinants of Health   Financial Resource Strain: Not on file  Food Insecurity: Not on file  Transportation Needs: Not on file  Physical Activity: Not on file  Stress: Not on file  Social Connections: Not on file  Intimate Partner Violence: Not on file   Current Facility-Administered Medications on File Prior to Encounter  Medication Dose Route Frequency Provider Last Rate Last Admin   TDaP (BOOSTRIX) injection 0.5 mL  0.5 mL Intramuscular Once Bronson Curb, Walidah N, CNM       Current Outpatient Medications on File Prior to Encounter  Medication Sig Dispense Refill   Prenatal Vit-Fe Fumarate-FA (MULTIVITAMIN-PRENATAL) 27-0.8 MG TABS tablet Take 1 tablet by mouth daily at 12 noon.     metoCLOPramide (REGLAN)  10 MG tablet Take 1 tablet (10 mg total) by mouth every 6 (six) hours as needed for nausea (nausea/headache). (Patient not taking: Reported on 01/23/2023) 10 tablet 0   No Known Allergies  I have reviewed patient's Past Medical Hx, Surgical Hx, Family Hx, Social Hx, medications and allergies.   ROS:  Review of Systems  Constitutional:  Negative for fever.  Gastrointestinal:  Positive for abdominal pain. Negative for constipation and diarrhea.  Genitourinary:  Positive for pelvic pain and vaginal bleeding. Negative for dysuria  and frequency.   Review of Systems  Other systems negative   Physical Exam  Physical Exam Patient Vitals for the past 24 hrs:  BP Temp Pulse Resp SpO2 Height Weight  01/23/23 0012 (!) 168/80 98.1 F (36.7 C) (!) 58 17 100 % 4\' 9"  (1.448 m) 69.9 kg   Constitutional: Well-developed, well-nourished female in no acute distress.  Cardiovascular: normal rate Respiratory: normal effort GI: Abd soft, non-tender.  MS: Extremities nontender, no edema, normal ROM Neurologic: Alert and oriented x 4.  GU: Neg CVAT.  PELVIC EXAM: deferred in lieu of ultrasound  LAB RESULTS Results for orders placed or performed during the hospital encounter of 01/22/23 (from the past 24 hour(s))  Urinalysis, Routine w reflex microscopic -Urine, Clean Catch     Status: Abnormal   Collection Time: 01/23/23 12:38 AM  Result Value Ref Range   Color, Urine YELLOW YELLOW   APPearance HAZY (A) CLEAR   Specific Gravity, Urine 1.014 1.005 - 1.030   pH 6.0 5.0 - 8.0   Glucose, UA NEGATIVE NEGATIVE mg/dL   Hgb urine dipstick NEGATIVE NEGATIVE   Bilirubin Urine NEGATIVE NEGATIVE   Ketones, ur NEGATIVE NEGATIVE mg/dL   Protein, ur NEGATIVE NEGATIVE mg/dL   Nitrite NEGATIVE NEGATIVE   Leukocytes,Ua NEGATIVE NEGATIVE       IMAGING US OB Comp Less 14 Wks  Result Date: 01/23/2023 CLINICAL DATA:  Continued vaginal bleeding. EXAM: OBSTETRIC <14 WK ULTRASOUND TECHNIQUE: Transabdominal ultrasound was performed for evaluation of the gestation as well as the maternal uterus and adnexal regions. COMPARISON:  12/10/2022. FINDINGS: Intrauterine gestational sac: Single Yolk sac:  No Embryo:  Yes Cardiac Activity: Yes Heart Rate: 167 bpm CRL:   67.9 mm   13 w 1 d                  [place] EDC: 07/30/2023. Subchorionic hemorrhage:  None visualized. Maternal uterus/adnexae: The ovaries are within normal limits. IMPRESSION: Single live intrauterine pregnancy with estimated gestational age of [redacted] weeks 1 day. EDC: 07/30/2023. Electronically  Signed   By: Thornell Sartorius M.D.   On: 01/23/2023 01:51     MAU Management/MDM: I have reviewed the triage vital signs and the nursing notes.   Pertinent labs & imaging results that were available during my care of the patient were reviewed by me and considered in my medical decision making (see chart for details).      I have reviewed her medical records including past results, notes and treatments. Medical, Surgical, and family history were reviewed.  Medications and recent lab tests were reviewed  Ordered Ultrasound to rule out SAB  This showed no abnormalities    Treatments in MAU included Ultrasound.   This bleeding/pain can represent a normal pregnancy with bleeding, spontaneous abortion or even an ectopic which can be life-threatening.  The process as listed above helps to determine which of these is present.    ASSESSMENT 1. Bleeding in early pregnancy   2.  Cramping affecting pregnancy, antepartum   3. [redacted] weeks gestation of pregnancy     PLAN Discharge home Bleeding precautions Message sent to clinic to arrange appointment due to patient's elevated BP tonight She had normal BPs in June, elevated tonight.  Will not medicate just yet, but may be a chronic Hypertensive.  Has not had recent medical care so is not aware of having hypertension   Follow-up Information     Department, [facility] Follow up.   Contact information: [address] [place] [address] [phone]                Pt stable at time of discharge. Encouraged to return here if she develops worsening of symptoms, increase in pain, fever, or other concerning symptoms.    Wynelle Bourgeois CNM, MSN Certified Nurse-Midwife 01/23/2023  1:48 AM

## 2023-01-23 NOTE — Progress Notes (Signed)
Wynelle Bourgeois CNM in to see pt and discuss test results and d/c plan. Written and verbal d/c instructions given and understanding voiced. Video Spanish interpreter used for d/c

## 2023-01-23 NOTE — MAU Note (Addendum)
.  Kathryn Glenn is a 42 y.o. at [redacted]w[redacted]d here in MAU reporting vag bleeding the entire preganancy. Bleeding more for the past 2months. Since yesterday she has had alittle heavier bleeding with small clots. Also reports abdominal pain that is like period-like cramping   Onset of complaint: yesterday Pain score: 5-8 Vitals:   01/23/23 0012  BP: (!) 168/80  Pulse: (!) 58  Resp: 17  Temp: 98.1 F (36.7 C)  SpO2: 100%     FHT: 168 Lab orders placed from triage: u/a

## 2023-02-10 ENCOUNTER — Other Ambulatory Visit (HOSPITAL_COMMUNITY)
Admission: RE | Admit: 2023-02-10 | Discharge: 2023-02-10 | Disposition: A | Discharge status: Home / Self Care | Payer: Self-pay | Source: Ambulatory Visit | Attending: Family Medicine | Admitting: Family Medicine

## 2023-02-10 ENCOUNTER — Ambulatory Visit (INDEPENDENT_AMBULATORY_CARE_PROVIDER_SITE_OTHER): Payer: Self-pay

## 2023-02-10 ENCOUNTER — Other Ambulatory Visit: Payer: Self-pay

## 2023-02-10 VITALS — BP 159/96 | HR 76 | Wt 155.5 lb

## 2023-02-10 DIAGNOSIS — O0992 Supervision of high risk pregnancy, unspecified, second trimester: Secondary | ICD-10-CM

## 2023-02-10 DIAGNOSIS — Z3A16 16 weeks gestation of pregnancy: Secondary | ICD-10-CM

## 2023-02-10 DIAGNOSIS — O0990 Supervision of high risk pregnancy, unspecified, unspecified trimester: Secondary | ICD-10-CM | POA: Insufficient documentation

## 2023-02-10 DIAGNOSIS — O10912 Unspecified pre-existing hypertension complicating pregnancy, second trimester: Secondary | ICD-10-CM

## 2023-02-10 MED ORDER — LABETALOL HCL 200 MG PO TABS
200.0000 mg | ORAL_TABLET | Freq: Two times a day (BID) | ORAL | 3 refills | Status: DC
Start: 2023-02-10 — End: 2023-02-17

## 2023-02-10 NOTE — Progress Notes (Signed)
New OB Intake  I connected with Kathryn Glenn  on 02/10/23 at  3:15 PM EDT by In Person Visit and verified that I am speaking with the correct person using two identifiers. Nurse is located at [facility] and pt is located at [facility].  I discussed the limitations, risks, security and privacy concerns of performing an evaluation and management service by telephone and the availability of in person appointments. I also discussed with the patient that there may be a patient responsible charge related to this service. The patient expressed understanding and agreed to proceed.  I explained I am completing New OB Intake today. We discussed EDD of 07/27/2023, by Ultrasound. Pt is [id number]. I reviewed her allergies, medications and Medical/Surgical/OB history.    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 Discussed the nature of our practice with multiple providers including residents and students. Due to the size of the practice, the delivering provider may not be the same as those providing prenatal care.   Patient is not interested in water birth. Offered upcoming OB visit with CNM to discuss further.  MyChart/Babyscripts MyChart access verified. I explained pt will have some visits in office and some virtually. Babyscripts instructions given and order placed. Patient verifies receipt of registration text/e-mail. Account successfully created and app downloaded.  Blood Pressure Cuff/Weight Scale Patient is self-pay; explained patient will be given BP cuff at first prenatal appt. Explained after first prenatal appt pt will check weekly and document  in Babyscripts. Patient does have weight scale.  Anatomy US Explained first scheduled [place] will be around 19 weeks. Anatomy US scheduled for 03/12/23 at 1215P.  Is patient a CenteringPregnancy candidate?  Not a Candidate  Not a candidate due to [facility], medication controlled If accepted,    Is patient a Mom+Baby Combined Care candidate?  Not a candidate   If accepted, confirm patient does not intend to move from the area for at least 12 months, then notify Mom+Baby staff  Interested in [place]? If yes, send referral and doula dot phrase.   Is patient a candidate for Babyscripts Optimization? No - CHTN-Meds  First visit review I reviewed new OB appt with patient. Explained pt will be seen by Dr.Eckstat at first visit. Discussed Avelina Laine genetic screening with patient. Done Panorama and Horizon.. Routine prenatal labs  collected today.    Last Pap No results found for: "DIAGPAP"  Henrietta Dine, [facility] 02/10/2023  3:19 PM

## 2023-02-10 NOTE — Progress Notes (Signed)
Today Pt. Her for New OB Intake, BP was elevated 159/96 & 156/88, spoke with Provider Dr. Briscoe Deutscher, she suggested for Pt to start Labetalol 200 bid, advised Pt & she verbalized understanding.

## 2023-02-11 ENCOUNTER — Telehealth: Payer: Self-pay | Admitting: General Practice

## 2023-02-11 ENCOUNTER — Encounter: Payer: Self-pay | Admitting: Family Medicine

## 2023-02-11 DIAGNOSIS — O0990 Supervision of high risk pregnancy, unspecified, unspecified trimester: Secondary | ICD-10-CM

## 2023-02-11 DIAGNOSIS — R7303 Prediabetes: Secondary | ICD-10-CM | POA: Insufficient documentation

## 2023-02-11 LAB — CBC/D/PLT+RPR+RH+ABO+RUBIGG...
Antibody Screen: NEGATIVE
Basophils Absolute: 0.1 10*3/uL (ref 0.0–0.2)
Basos: 1 %
EOS (ABSOLUTE): 0.1 10*3/uL (ref 0.0–0.4)
Eos: 2 %
HCV Ab: NONREACTIVE
HIV Screen 4th Generation wRfx: NONREACTIVE
Hematocrit: 40.7 % (ref 34.0–46.6)
Hemoglobin: 13.6 g/dL (ref 11.1–15.9)
Hepatitis B Surface Ag: NEGATIVE
Immature Grans (Abs): 0 10*3/uL (ref 0.0–0.1)
Immature Granulocytes: 0 %
Lymphocytes Absolute: 2.2 10*3/uL (ref 0.7–3.1)
Lymphs: 31 %
MCH: 28.3 pg (ref 26.6–33.0)
MCHC: 33.4 g/dL (ref 31.5–35.7)
MCV: 85 fL (ref 79–97)
Monocytes Absolute: 0.8 10*3/uL (ref 0.1–0.9)
Monocytes: 11 %
Neutrophils Absolute: 4.1 10*3/uL (ref 1.4–7.0)
Neutrophils: 55 %
Platelets: 186 10*3/uL (ref 150–450)
RBC: 4.81 x10E6/uL (ref 3.77–5.28)
RDW: 19.4 % — ABNORMAL HIGH (ref 11.7–15.4)
RPR Ser Ql: NONREACTIVE
Rh Factor: POSITIVE
Rubella Antibodies, IGG: 27.7 {index} (ref >0.99)
WBC: 7.3 10*3/uL (ref 3.4–10.8)

## 2023-02-11 LAB — COMPREHENSIVE METABOLIC PANEL
ALT: 24 IU/L (ref 0–32)
AST: 24 IU/L (ref 0–40)
Albumin: 3.9 g/dL (ref 3.9–4.9)
Alkaline Phosphatase: 77 IU/L (ref 44–121)
BUN/Creatinine Ratio: 14 (ref 9–23)
BUN: 8 mg/dL (ref 6–24)
Bilirubin Total: <0.2 mg/dL (ref 0.0–1.2)
CO2: 17 mmol/L — ABNORMAL LOW (ref 20–29)
Calcium: 9.4 mg/dL (ref 8.7–10.2)
Chloride: 105 mmol/L (ref 96–106)
Creatinine, Ser: 0.56 mg/dL — ABNORMAL LOW (ref 0.57–1.00)
Globulin, Total: 2.5 g/dL (ref 1.5–4.5)
Glucose: 110 mg/dL — ABNORMAL HIGH (ref 70–99)
Potassium: 4 mmol/L (ref 3.5–5.2)
Sodium: 137 mmol/L (ref 134–144)
Total Protein: 6.4 g/dL (ref 6.0–8.5)
eGFR: 116 mL/min/{1.73_m2} (ref >59)

## 2023-02-11 LAB — HEMOGLOBIN A1C
Est. average glucose Bld gHb Est-mCnc: 123 mg/dL
Hgb A1c MFr Bld: 5.9 % — ABNORMAL HIGH (ref 4.8–5.6)

## 2023-02-11 LAB — HCV INTERPRETATION

## 2023-02-11 LAB — TSH: TSH: 1.98 u[IU]/mL (ref 0.450–4.500)

## 2023-02-11 LAB — PROTEIN / CREATININE RATIO, URINE
Creatinine, Urine: 18.3 mg/dL
Protein, Ur: 4.9 mg/dL
Protein/Creat Ratio: 268 mg/g{creat} — ABNORMAL HIGH (ref 0–200)

## 2023-02-11 NOTE — Telephone Encounter (Signed)
-----   Message from Venora Maples sent at 02/11/2023  7:50 AM EDT ----- A1c in prediabetic range, will need to do a 2 hr GTT, please schedule Remainder of new OB labs unremarkable

## 2023-02-11 NOTE — Addendum Note (Signed)
Addended by: Kathee Delton on: 02/11/2023 02:24 PM   Modules accepted: Orders

## 2023-02-11 NOTE — Telephone Encounter (Signed)
Called patient with Kathryn Glenn assisting with Spanish interpretation and informed her of results. Appt scheduled for 8/23 @ 930.

## 2023-02-12 LAB — GC/CHLAMYDIA PROBE AMP (~~LOC~~) NOT AT ARMC
Chlamydia: NEGATIVE
Comment: NEGATIVE
Comment: NORMAL
Neisseria Gonorrhea: NEGATIVE

## 2023-02-13 ENCOUNTER — Other Ambulatory Visit: Payer: Self-pay

## 2023-02-13 DIAGNOSIS — O0990 Supervision of high risk pregnancy, unspecified, unspecified trimester: Secondary | ICD-10-CM

## 2023-02-14 LAB — GLUCOSE TOLERANCE, 2 HOURS W/ 1HR
Glucose, 1 hour: 183 mg/dL — ABNORMAL HIGH (ref 70–179)
Glucose, 2 hour: 159 mg/dL — ABNORMAL HIGH (ref 70–152)
Glucose, Fasting: 83 mg/dL (ref 70–91)

## 2023-02-14 LAB — CULTURE, OB URINE

## 2023-02-14 LAB — URINE CULTURE, OB REFLEX

## 2023-02-16 ENCOUNTER — Other Ambulatory Visit: Payer: Self-pay | Admitting: Family Medicine

## 2023-02-16 ENCOUNTER — Telehealth: Payer: Self-pay | Admitting: General Practice

## 2023-02-16 ENCOUNTER — Encounter: Payer: Self-pay | Admitting: Family Medicine

## 2023-02-16 DIAGNOSIS — R8271 Bacteriuria: Secondary | ICD-10-CM

## 2023-02-16 DIAGNOSIS — O24419 Gestational diabetes mellitus in pregnancy, unspecified control: Secondary | ICD-10-CM | POA: Insufficient documentation

## 2023-02-16 DIAGNOSIS — O24410 Gestational diabetes mellitus in pregnancy, diet controlled: Secondary | ICD-10-CM

## 2023-02-16 MED ORDER — AMOXICILLIN 500 MG PO CAPS
500.0000 mg | ORAL_CAPSULE | Freq: Three times a day (TID) | ORAL | 2 refills | Status: DC
Start: 2023-02-16 — End: 2023-03-10

## 2023-02-16 NOTE — Progress Notes (Signed)
Patient had abnormal early A1c, has failed 2hr GTT, new diagnosis of GDM. Please notify patient, send testing supplies, and coordinate DM educator visit.

## 2023-02-16 NOTE — Telephone Encounter (Signed)
Called patient with Kathryn Glenn assisting with spanish interpretation, no answer- left message to call us back. Referral placed.

## 2023-02-16 NOTE — Telephone Encounter (Signed)
-----   Message from Kathryn Glenn sent at 02/16/2023  2:16 PM EDT ----- Patient had abnormal early A1c, has failed 2hr GTT, new diagnosis of GDM. Please notify patient, send testing supplies, and coordinate DM educator visit.

## 2023-02-17 ENCOUNTER — Encounter: Payer: Self-pay | Admitting: Family Medicine

## 2023-02-17 ENCOUNTER — Ambulatory Visit (INDEPENDENT_AMBULATORY_CARE_PROVIDER_SITE_OTHER): Payer: Self-pay | Admitting: Family Medicine

## 2023-02-17 VITALS — BP 151/89 | HR 66 | Wt 154.8 lb

## 2023-02-17 DIAGNOSIS — O10919 Unspecified pre-existing hypertension complicating pregnancy, unspecified trimester: Secondary | ICD-10-CM

## 2023-02-17 DIAGNOSIS — O34219 Maternal care for unspecified type scar from previous cesarean delivery: Secondary | ICD-10-CM

## 2023-02-17 DIAGNOSIS — I10 Essential (primary) hypertension: Secondary | ICD-10-CM | POA: Insufficient documentation

## 2023-02-17 DIAGNOSIS — O0990 Supervision of high risk pregnancy, unspecified, unspecified trimester: Secondary | ICD-10-CM

## 2023-02-17 DIAGNOSIS — Z8759 Personal history of other complications of pregnancy, childbirth and the puerperium: Secondary | ICD-10-CM

## 2023-02-17 DIAGNOSIS — O09899 Supervision of other high risk pregnancies, unspecified trimester: Secondary | ICD-10-CM

## 2023-02-17 DIAGNOSIS — O10912 Unspecified pre-existing hypertension complicating pregnancy, second trimester: Secondary | ICD-10-CM

## 2023-02-17 DIAGNOSIS — R8271 Bacteriuria: Secondary | ICD-10-CM

## 2023-02-17 DIAGNOSIS — Z8659 Personal history of other mental and behavioral disorders: Secondary | ICD-10-CM

## 2023-02-17 DIAGNOSIS — O24419 Gestational diabetes mellitus in pregnancy, unspecified control: Secondary | ICD-10-CM

## 2023-02-17 LAB — HORIZON CUSTOM: REPORT SUMMARY: NEGATIVE

## 2023-02-17 LAB — PANORAMA PRENATAL TEST FULL PANEL:PANORAMA TEST PLUS 5 ADDITIONAL MICRODELETIONS: FETAL FRACTION: 6.4

## 2023-02-17 MED ORDER — LABETALOL HCL 200 MG PO TABS
400.0000 mg | ORAL_TABLET | Freq: Two times a day (BID) | ORAL | 11 refills | Status: DC
Start: 2023-02-17 — End: 2023-04-10

## 2023-02-17 MED ORDER — ACCU-CHEK GUIDE W/DEVICE KIT
1.0000 | PACK | Freq: Four times a day (QID) | 0 refills | Status: AC
Start: 2023-02-17 — End: ?

## 2023-02-17 MED ORDER — ASPIRIN 81 MG PO TBEC
162.0000 mg | DELAYED_RELEASE_TABLET | Freq: Every day | ORAL | 5 refills | Status: AC
Start: 2023-02-17 — End: ?

## 2023-02-17 NOTE — Progress Notes (Signed)
Subjective:   Kathryn Glenn is a 42 y.o. [id number] at [redacted]w[redacted]d by early ultrasound being seen today for her first obstetrical visit.  Her obstetrical history is significant for  chronic hypertension, early diagnosis of GDM, prior classical cesarean delivery, history of PP depression . Patient does intend to breast feed. Pregnancy history fully reviewed.  Patient reports she continues to have vaginal spotting (was seen in [place] earlier in pregnancy for the same).  HISTORY: OB History  Gravida Para Term Preterm AB Living  7 4 3 1 2 4   SAB IAB Ectopic Multiple Live Births  0 1 1 0 4    # Outcome Date GA Lbr Len/2nd Weight Sex Type Anes PTL Lv  7 Current           6 Ectopic 2022          5 Term 08/10/12 [redacted]w[redacted]d  5 lb 9.2 oz (2.529 kg) F CS-LTranv Spinal  LIV     Name: Kathryn Glenn  4 IAB 2011          3 Preterm 2003 [redacted]w[redacted]d   M CS-Classical EPI Y LIV  2 Term 2002 108w0d  7 lb (3.175 kg) M Vag-Spont EPI N LIV  1 Term 2001 [redacted]w[redacted]d  8 lb 8 oz (3.856 kg) F Vag-Spont EPI N LIV    Obstetric Comments  2003 does not know what happened, "was in a deep depression, was not getting care"     Last pap smear: No results found for: "DIAGPAP", "HPV", "HPVHIGH" *needs*>referral to [facility]  Past Medical History:  Diagnosis Date   Depression    No pertinent past medical history    Preterm labor    Past Surgical History:  Procedure Laterality Date   APPENDECTOMY     CESAREAN SECTION     C/S x 1   CESAREAN SECTION N/A 08/10/2012   Procedure: CESAREAN SECTION;  Surgeon: Catalina Antigua, MD;  Location: [facility];  Service: Obstetrics;  Laterality: N/A;  Repeat   Family History  Problem Relation Age of Onset   Stroke Mother        lost her vision   Healthy Father    Social History   Tobacco Use   Smoking status: Never   Smokeless tobacco: Never  Vaping Use   Vaping status: Never Used  Substance Use Topics   Alcohol use: No   Drug use: No   No Known Allergies Current  Outpatient Medications on File Prior to Visit  Medication Sig Dispense Refill   labetalol (NORMODYNE) 200 MG tablet Take 1 tablet (200 mg total) by mouth 2 (two) times daily. 60 tablet 3   Prenatal Vit-Fe Fumarate-FA (MULTIVITAMIN-PRENATAL) 27-0.8 MG TABS tablet Take 1 tablet by mouth daily at 12 noon.     amoxicillin (AMOXIL) 500 MG capsule Take 1 capsule (500 mg total) by mouth 3 (three) times daily. (Patient not taking: Reported on 02/17/2023) 21 capsule 2   metoCLOPramide (REGLAN) 10 MG tablet Take 1 tablet (10 mg total) by mouth every 6 (six) hours as needed for nausea (nausea/headache). (Patient not taking: Reported on 01/23/2023) 10 tablet 0   Current Facility-Administered Medications on File Prior to Visit  Medication Dose Route Frequency Provider Last Rate Last Admin   TDaP (BOOSTRIX) injection 0.5 mL  0.5 mL Intramuscular Once Amedeo Gory, CNM         Exam   Vitals:   02/17/23 1357 02/17/23 1404  BP: (!) 157/95 (!) 151/89  Pulse:  67 66  Weight: 154 lb 12.8 oz (70.2 kg)    Fetal Heart Rate (bpm): 156  System: General: well-developed, well-nourished female in no acute distress   Skin: normal coloration and turgor, no rashes   Neurologic: oriented, normal, negative, normal mood   Extremities: normal strength, tone, and muscle mass, ROM of all joints is normal   HEENT PERRLA, extraocular movement intact and sclera clear, anicteric   Neck supple and no masses   Respiratory:  no respiratory distress      Assessment:   Pregnancy: [id number] Patient Active Problem List   Diagnosis Date Noted   Chronic hypertension during pregnancy, antepartum 02/17/2023   GDM (gestational diabetes mellitus) 02/16/2023   GBS bacteriuria 02/16/2023   Prediabetes 02/11/2023   Supervision of high risk pregnancy, antepartum 02/10/2023   History of postpartum depression 09/06/2012   S/P cesarean section 08/13/2012   Previous classical cesarean delivery, antepartum condition or  complication 04/22/2012   History of preterm delivery, currently pregnant 04/05/2012     Plan:  1. Supervision of high risk pregnancy, antepartum BP elevated, see below FHR normal Initial labs reviewed and are notable for GBS bacteriuria and early diagnosis of GDM, see below Continue prenatal vitamins. Genetic Screening discussed, NIPS: low risk. Ultrasound discussed; fetal anatomic survey: ordered. Problem list reviewed and updated. The nature of [facility] with multiple MDs and other Advanced Practice Providers was explained to patient; also emphasized that residents, students are part of our team.  2. Previous classical cesarean delivery, antepartum condition or complication Per review of chart history is highly suspicious for classical cesarean in [place] with 28 wk delivery and afterwards told not to get pregnant for ten years Plan for delivery at 36-37 weeks Discussed BTL, she is considering between that and Nexplanon. Advised her that given clinical history BTL would be safer option.   3. Gestational diabetes mellitus (GDM) in second trimester, gestational diabetes method of control unspecified Abnormal A1c of 5.9%, 2hr gtt was subsequently abnormal Given testing strips and lancets today in clinic, rx sent for glucometer Will schedule with diabetes educator asap  4. GBS bacteriuria 10-25k on new OB urine culture Rx previously sent, she will plan to pick it up today  5. History of preterm delivery, currently pregnant At [redacted] weeks, suspect placental abruption based on history of bleeding at the time Has had three other term deliveries  6. History of postpartum depression PHQ9 today was 9 Plan for SW consult/mood check PP  7. Chronic hypertension during pregnancy, antepartum Elevated BP at new OB intake and again today c/w diagnosis of chronic hypertension Baseline labs previously obtained and were unremarkable Reports she is taking  labetalol 200 mg daily, will increase to 400 mg BID Start high dose ASA 162 mg daily Antenatal testing per MFM guidelines   Routine obstetric precautions reviewed. Return in 2 weeks (on 03/03/2023) for [facility], ob visit, needs MD.

## 2023-02-17 NOTE — Patient Instructions (Signed)
Segundo trimestre de [employer] Second Trimester of Pregnancy  El segundo trimestre de Psychiatrist va desde la semana 13 hasta la semana 27. Es Designer, jewellery desde el mes 4 hasta el mes 6 de [place]. El segundo trimestre suele ser el momento en el que mejor se siente. Su organismo se ha adaptado a Charity fundraiser, y comienza a Diplomatic Services operational officer. Durante el segundo trimestre: Las nuseas del embarazo han disminuido o han desaparecido completamente. Usted puede tener ms energa. Es posible que tenga un aumento del apetito. El segundo trimestre es tambin un perodo en el que el beb en gestacin (feto) crece rpidamente. Hacia el final del sexto mes, el feto puede medir aproximadamente 12 pulgadas y pesar alrededor de 1 libras. Es probable que sienta que el beb se Teacher, English as a foreign language (da pataditas) entre las 16 y 20semanas del Psychiatrist. Cambios en el cuerpo durante el segundo trimestre Su cuerpo continua experimentando numerosos cambios durante su segundo trimestre. Los cambios varan y generalmente vuelven a la normalidad despus del nacimiento del beb. Cambios fsicos Seguir American Standard Companies. Notar que la parte baja del abdomen sobresale. Podrn aparecer las primeras Albertson's caderas, el abdomen y las [place]. Las ConAgra Foods seguirn creciendo y se tornarn sensibles. Pueden aparecer zonas oscuras o manchas (cloasma o mscara del embarazo) en el rostro. Es posible que se forme una lnea oscura desde el ombligo hasta la zona del pubis (linea nigra). Tal vez haya cambios en el cabello. Esto cambios pueden incluir su engrosamiento, crecimiento rpido y Allied Waste Industries textura. A algunas personas tambin se les cae el cabello durante o despus del [place], o tienen el cabello seco o fino. Cambios en la salud Comienza a tener dolores de [place]. Es posible que tenga acidez estomacal. Puede tener estreimiento. Pueden aparecer hemorroides o abultarse e hincharse las venas (venas varicosas). Las Print production planner y estar sensibles al cepillado y al hilo dental. Nicanor Bake vez tenga necesidad de orinar con ms frecuencia porque el feto est ejerciendo presin sobre la vejiga. Puede sentir dolor en la espalda. Esto se debe a: Aumento de peso. Las hormonas del Management consultant las articulaciones en la pelvis. Un cambio en el peso y los msculos que ayudan a Pharmacologist su equilibrio. Siga estas instrucciones en su casa: Medicamentos Siga las instrucciones del mdico en relacin con el uso de medicamentos. Durante el embarazo, hay medicamentos que pueden tomarse y otros que no. No tome medicamentos a menos que lo haya autorizado el mdico. Tome vitaminas prenatales que contengan por lo menos (mcg) de cido flico. Comida y bebida Lleve una dieta saludable que incluya frutas y verduras frescas, cereales integrales, buenas fuentes de protenas como carnes [place], huevos o tofu, y productos lcteos descremados. Evite la carne cruda y el [place], la [place] y el queso sin Market researcher. Estos portan grmenes que pueden provocar dao tanto a usted como al beb. Es posible que tenga que tomar estas medidas para prevenir o tratar el estreimiento: Product manager suficiente lquido como para Pharmacologist la orina de color amarillo plido. Consumir alimentos ricos en fibra, como frijoles, cereales integrales, y frutas y verduras frescas. Limitar el consumo de alimentos ricos en grasa y azcares procesados, como los alimentos fritos o dulces. Actividad Haga ejercicio solamente como se lo haya indicado el mdico. La mayora de las personas pueden continuar su rutina de ejercicios durante el [place]. Intente realizar como mnimo de actividad fsica por lo menos 5das a la semana. Deje de hacer ejercicio si comienza a Systems analyst  el tero. Deje de hacer ejercicio si le aparecen dolor o clicos en la parte baja del vientre o de la espalda. Evite hacer ejercicio si hace mucho calor o humedad, o si se  encuentra a una altitud elevada. Evite levantar pesos Fortune Brands. Si lo desea, puede seguir teniendo [employer] [place] [place] [employer], salvo que el mdico le indique lo contrario. Alivio del dolor y del Dentist Use un sujetador que le brinde buen soporte para prevenir las molestias causadas por la sensibilidad en las [place]. Dese baos de asiento con agua tibia para Engineer, materials o las molestias causadas por las hemorroides. Use una crema para las hemorroides si el mdico la autoriza. Descanse con las piernas levantadas (elevadas) si tiene calambres en las piernas o dolor en la parte baja de la espalda. Si tiene venas varicosas: Use medias de compresin como se lo haya indicado el mdico. Eleve los pies durante , 3 o 4veces por Futures trader. Limite el consumo de sal en su dieta. Seguridad Use el cinturn de seguridad en todo momento mientras conduce o va en auto. Hable con el mdico si es vctima de [employer] o fsico. Estilo de vida No se d baos de inmersin en agua caliente, baos turcos ni saunas. No se haga duchas vaginales. No use tampones ni toallas higinicas perfumadas. Evite el contacto con las bandejas sanitarias de los gatos y la tierra que estos animales usan. Estos elementos contienen bacterias que pueden causar defectos congnitos al beb y la posible prdida del feto debido a un aborto espontneo o muerte fetal. No use remedios a base de hierbas, alcohol, drogas ilegales ni medicamentos que no estn aprobados por el mdico. Las sustancias qumicas de estos productos pueden daar al beb. No consuma ningn producto que contenga nicotina o tabaco, como cigarrillos, cigarrillos electrnicos y tabaco de Theatre manager. Si necesita ayuda para dejar de fumar, consulte al mdico. Instrucciones generales Durante una visita prenatal de rutina, el Office Depot har un examen fsico y Probation officer. Tambin le hablar sobre su salud general. Cumpla con todas las visitas de seguimiento. Esto es  importante. Pdale al mdico que la derive a clases de educacin prenatal en su localidad. Pida ayuda si tiene necesidades nutricionales o de asesoramiento Academic librarian. El mdico puede aconsejarla o derivarla a especialistas para que la ayuden con diferentes necesidades. Dnde buscar ms informacin American Pregnancy Association (Asociacin Americana del Embarazo): americanpregnancy.org Celanese Corporation of Obstetricians and Gynecologists (Colegio Estadounidense de Obstetras y [place]): EmploymentAssurance.cz? Office on Pitney Bowes (Oficina para la Salud de la Mujer): MightyReward.co.nz Comunquese con un mdico si tiene: Un dolor de cabeza que no desaparece despus de Science writer. Cambios en la visin o ve manchas delante de los ojos. Clicos leves, presin en la pelvis o dolor persistente en el abdomen. Nuseas persistentes, vmitos o diarrea. Secrecin vaginal con mal olor u orina con Charles Schwab ftido. Dolor al Beatrix Shipper. Hinchazn sbita o extrema del rostro, las [address], los tobillos, los pies o las piernas. Grant Ruts. Busque ayuda de inmediato si: Tiene una prdida de lquido por la vagina. Tiene sangrado ligero o manchas vaginales. Tiene dolor intenso o clicos en el abdomen. Presenta dificultad para respirar. Siente dolor en el pecho. Tiene episodios de Baxter International. No ha sentido a su beb moverse durante el perodo de Sempra Energy indic el mdico. Tiene dolor, hinchazn o enrojecimiento nuevos o ms intensos en un brazo o una pierna. Resumen El segundo trimestre de embarazo va desde la semana 13 hasta la 27 (desde el mes 4 Whole Foods  el 6). No use remedios a base de hierbas, alcohol, drogas ilegales ni medicamentos que no estn aprobados por el mdico. Las sustancias qumicas de estos productos pueden daar al beb. Haga ejercicio solamente como se lo haya indicado el mdico. La mayora de las personas pueden continuar su rutina de ejercicios durante el  [place]. Cumpla con todas las visitas de seguimiento. Esto es importante. Esta informacin no tiene Theme park manager el consejo del mdico. Asegrese de hacerle al mdico cualquier pregunta que tenga. Document Revised: 12/19/2019 Document Reviewed: 12/19/2019 Elsevier Patient Education  2024 Elsevier Inc.  Opciones de mtodos anticonceptivos Birth Control Options Los mtodos anticonceptivos tambin se denominan anticonceptivos. Los anticonceptivos previenen Firefighter. Hay muchos tipos de anticonceptivos. Trabaje con el mdico para encontrar la opcin ms adecuada para usted. Anticonceptivos que [place] hormonas Estos tipos de anticonceptivos contienen hormonas para Neurosurgeon. Implante anticonceptivo Este es un pequeo tubo que se coloca dentro de la piel del brazo. El tubo Insurance claims handler colocado durante 3 aos como mximo. Inyeccin anticonceptiva Son inyecciones que se aplican cada 3 meses. Pldoras anticonceptivas Esta es una pldora que se toma todos [place]. Debe tomarla a la Smith International. Parche anticonceptivo Este es un parche que se coloca sobre la piel. Se debe cambiar 1 vez por semana durante 3 semanas. Despus de SYSCO, el parche se debe retirar durante 1semana. Anillo vaginal  Este es un anillo de plstico blando que se coloca dentro de la vagina. El anillo se deja colocado durante 3 semanas. Luego, se debe retirar durante 1 semana. Despus se coloca un nuevo anillo. Mtodos de barrera  Preservativo masculino Este es una cubierta delgada que se coloca sobre el pene antes de tener sexo. El preservativo se desecha despus de Doctor, hospital. Preservativo femenino Este es una cubierta blanda y suelta que se coloca en la vagina antes de [place]. El preservativo se desecha despus de Doctor, hospital. Diafragma El diafragma es una barrera blanda con forma de tazn. Debe estar hecho para adaptarse a su cuerpo. Se coloca en la vagina antes de tener  sexo con una sustancia qumica que destruye los espermatozoides llamada espermicida. El Designer, fashion/clothing en la vagina durante 6 a 8 horas despus de tener sexo y debe retirarse en un plazo de 24 horas. El diafragma se debe reemplazar: Cada 1 o 2 aos. Despus de dar a luz. Despus de aumentar ms de 15 libras (6.8kg). Si se somete a una ciruga en la pelvis. Capuchn cervical Este es un capuchn pequeo y [place] se fija sobre el cuello uterino. El cuello uterino es la parte ms baja del tero. Se coloca en la vagina antes del sexo, junto con un espermicida. El capuchn debe fabricarse para usted. El capuchn se debe dejar colocado durante 6 a 8horas despus del sexo. Se debe retirar en un plazo de 48horas. El capuchn cervical debe ser recetado y adaptado a su cuerpo por un mdico. Debe reemplazarse cada 2aos. Esponja Esta es una esponja pequea que se coloca en la vagina antes de [place]. Se debe dejar colocada durante al menos 6horas despus de eBay. Se debe retirar en un plazo de 30horas y desecharse. Espermicidas Estos son sustancias qumicas que destruyen o impiden que los espermatozoides ingresen al tero. Se pueden presentar en forma de pldora, crema, gel o espuma que se debe colocar en la vagina. Se deben usar al Lowe's Companies de 10 a antes de eBay. Dispositivo intrauterino ALLTEL Corporation  dispositivo intrauterino (DIU) es un dispositivo que un mdico coloca dentro del tero. Existen dos tipos: DIU hormonal. Este tipo puede permanecer colocado durante 3 a 5aos. DIU de cobre. Este tipo Insurance claims handler colocado durante 10aos. Mtodos anticonceptivos permanentes Ligadura de trompas en la mujer Esto es una ciruga para obstruir las trompas de [place]. Esterilizacin histeroscpica Este es un procedimiento para Scientific laboratory technician un dispositivo en cada trompa de [place]. Este mtodo funciona al cabo de 3 meses. Se deben usar otros mtodos anticonceptivos durante 3  meses. Esterilizacin masculina Esta es una ciruga, llamada vasectoma, para ligar los conductos que transportan los espermatozoides en los hombres. Este mtodo funciona al cabo de 3 meses. Se deben usar otros mtodos anticonceptivos durante 3 meses. Mtodos de planificacin natural Esto significa no tener Family Dollar Stores la pareja femenina podra quedar embarazada. A continuacin se mencionan algunos mtodos anticonceptivos por planificacin natural: Usar un calendario a fin de: Hacer un seguimiento de la duracin de cada ciclo menstrual. Determinar en [employer]. [employer] se podra producir Firefighter. Planificar no tener United States Steel Corporation en que se podra producir Firefighter. Reconocer los signos de la ovulacin y no tener relaciones sexuales durante ese perodo. La pareja femenina puede detectar cundo ser la ovulacin haciendo un seguimiento de su temperatura todos [place]. Tambin puede examinar si hay cambios en la mucosidad que proviene del cuello uterino. Dnde obtener ms informacin Centers for Disease Control and Prevention (Centros para el Control y la Prevencin de [place]): TonerPromos.no Esta informacin no tiene Theme park manager el consejo del mdico. Asegrese de hacerle al mdico cualquier pregunta que tenga. Document Revised: 09/18/2022 Document Reviewed: 09/18/2022 Elsevier Patient Education  2024 ArvinMeritor.

## 2023-02-19 NOTE — Telephone Encounter (Signed)
Results reviewed by Crissie Reese MD at routine appt.

## 2023-02-20 ENCOUNTER — Encounter: Payer: Self-pay | Admitting: Skilled Nursing Facility1

## 2023-02-20 ENCOUNTER — Encounter: Payer: Self-pay | Attending: Family Medicine | Admitting: Skilled Nursing Facility1

## 2023-02-20 VITALS — Ht 59.0 in | Wt 155.0 lb

## 2023-02-20 DIAGNOSIS — O24419 Gestational diabetes mellitus in pregnancy, unspecified control: Secondary | ICD-10-CM | POA: Insufficient documentation

## 2023-02-20 NOTE — Progress Notes (Signed)
Patient was seen for Gestational Diabetes self-management on 02/20/2023   Start time 11:15  and End time 12:15   Estimated due date: 17 weeks4days  Pacific Interpreter ID [id number]  Clinical: Medications: HTN medication  Medical History: HTN Labs: OGTT 1 hour 183, 2 hour 159 Lab Results  Component Value Date   HGBA1C 5.9 (H) 02/10/2023    Dietary and Lifestyle History:  Pt states she does not read and write; Her daughter is with her here today which will help her as well as others in the house.  Pt states she will eat the kiwi fruit skin.   Physical Activity: cleaning her house  Stress:  Sleep:   24 hr Recall: eating fast food about 2 days a week  First Meal 9am: 2 egg + 2 tortilla + cheese or butter or cereal  or sweet bread Snack: fruit Second meal 1-2: chicken + salad + 3 tortilla + rice (guessed about 1 cup) Snack: Third meal 7-8: salad and sour cream and corn   Initial Follow-up  [x]  []  Definition of Gestational Diabetes [x]  []  Why dietary management is important in controlling blood glucose [x]  []  Effects each nutrient has on blood glucose levels [x]  []  Simple carbohydrates vs complex carbohydrates [x]  []  Fluid intake [x]  []  Creating a balanced meal plan [x]  []  Carbohydrate counting  [x]  []  When to check blood glucose levels [x]  []  Proper blood glucose monitoring techniques [x]  []  Effect of stress and stress reduction techniques  [x]  []  Exercise effect on blood glucose levels, appropriate exercise during pregnancy [x]  []  Importance of limiting caffeine and abstaining from alcohol and smoking [x]  []  Medications used for blood sugar control during pregnancy [x]  []  Hypoglycemia and rule of 15 [x]  []  Postpartum self care  Blood glucose monitor given: Prodigy  Lot # [id number] Exp: 04/23/2023 Strips: [id number] CBG: 70 mg/dL having eaten about 3.5 hours previous    Patient instructed to monitor glucose levels: FBS: 60 - ? 95 mg/dL; 2 hour: ? 948 mg/dL  Patient  received handouts: Nutrition Diabetes and Pregnancy Carbohydrate Counting List  Patient will be seen for follow-up as needed.

## 2023-03-10 ENCOUNTER — Encounter: Payer: Self-pay | Attending: Family Medicine | Admitting: Dietician

## 2023-03-10 ENCOUNTER — Ambulatory Visit (INDEPENDENT_AMBULATORY_CARE_PROVIDER_SITE_OTHER): Payer: Self-pay | Admitting: Obstetrics and Gynecology

## 2023-03-10 ENCOUNTER — Ambulatory Visit (INDEPENDENT_AMBULATORY_CARE_PROVIDER_SITE_OTHER): Payer: Self-pay | Admitting: Dietician

## 2023-03-10 ENCOUNTER — Other Ambulatory Visit: Payer: Self-pay

## 2023-03-10 ENCOUNTER — Encounter: Payer: Self-pay | Admitting: Obstetrics and Gynecology

## 2023-03-10 VITALS — BP 158/90 | HR 63 | Wt 158.4 lb

## 2023-03-10 DIAGNOSIS — O09899 Supervision of other high risk pregnancies, unspecified trimester: Secondary | ICD-10-CM | POA: Insufficient documentation

## 2023-03-10 DIAGNOSIS — Z3A20 20 weeks gestation of pregnancy: Secondary | ICD-10-CM

## 2023-03-10 DIAGNOSIS — O24419 Gestational diabetes mellitus in pregnancy, unspecified control: Secondary | ICD-10-CM | POA: Insufficient documentation

## 2023-03-10 DIAGNOSIS — O24410 Gestational diabetes mellitus in pregnancy, diet controlled: Secondary | ICD-10-CM

## 2023-03-10 DIAGNOSIS — O34219 Maternal care for unspecified type scar from previous cesarean delivery: Secondary | ICD-10-CM | POA: Insufficient documentation

## 2023-03-10 DIAGNOSIS — R8271 Bacteriuria: Secondary | ICD-10-CM

## 2023-03-10 DIAGNOSIS — O10919 Unspecified pre-existing hypertension complicating pregnancy, unspecified trimester: Secondary | ICD-10-CM

## 2023-03-10 DIAGNOSIS — Z603 Acculturation difficulty: Secondary | ICD-10-CM

## 2023-03-10 DIAGNOSIS — O0990 Supervision of high risk pregnancy, unspecified, unspecified trimester: Secondary | ICD-10-CM

## 2023-03-10 DIAGNOSIS — O99820 Streptococcus B carrier state complicating pregnancy: Secondary | ICD-10-CM | POA: Insufficient documentation

## 2023-03-10 DIAGNOSIS — O09892 Supervision of other high risk pregnancies, second trimester: Secondary | ICD-10-CM

## 2023-03-10 DIAGNOSIS — Z3A00 Weeks of gestation of pregnancy not specified: Secondary | ICD-10-CM | POA: Insufficient documentation

## 2023-03-10 DIAGNOSIS — O0992 Supervision of high risk pregnancy, unspecified, second trimester: Secondary | ICD-10-CM

## 2023-03-10 DIAGNOSIS — O10912 Unspecified pre-existing hypertension complicating pregnancy, second trimester: Secondary | ICD-10-CM

## 2023-03-10 DIAGNOSIS — Z758 Other problems related to medical facilities and other health care: Secondary | ICD-10-CM

## 2023-03-10 NOTE — Progress Notes (Signed)
Subjective:  Kathryn Glenn is a 42 y.o. [id number] at [redacted]w[redacted]d being seen today for ongoing prenatal care.  She is currently monitored for the following issues for this high-risk pregnancy and has History of preterm delivery, currently pregnant; Previous classical cesarean delivery, antepartum condition or complication; S/P cesarean section; History of postpartum depression; Supervision of high risk pregnancy, antepartum; Prediabetes; GDM (gestational diabetes mellitus); GBS bacteriuria; Chronic hypertension during pregnancy, antepartum; and Language barrier on their problem list.  Patient reports no complaints.  Contractions: Not present. Vag. Bleeding: Scant (with whole pregnancy).  Movement: Absent. Denies leaking of fluid.   The following portions of the patient's history were reviewed and updated as appropriate: allergies, current medications, past family history, past medical history, past social history, past surgical history and problem list. Problem list updated.  Objective:   Vitals:   03/10/23 0916 03/10/23 0928  BP: (!) 164/81 (!) 158/90  Pulse: 62 63  Weight: 71.8 kg     Fetal Status: Fetal Heart Rate (bpm): 139   Movement: Absent     General:  Alert, oriented and cooperative. Patient is in no acute distress.  Skin: Skin is warm and dry. No rash noted.   Cardiovascular: Normal heart rate noted  Respiratory: Normal respiratory effort, no problems with respiration noted  Abdomen: Soft, gravid, appropriate for gestational age. Pain/Pressure: Present     Pelvic:  Cervical exam deferred        Extremities: Normal range of motion.  Edema: None  Mental Status: Normal mood and affect. Normal behavior. Normal judgment and thought content.   Urinalysis:      Assessment and Plan:  Pregnancy: [id number] at [redacted]w[redacted]d  1. Supervision of high risk pregnancy, antepartum Stable Vaccine letter fro [facility] provided to pt  2. Chronic hypertension during pregnancy, antepartum Has not taken BP  medication today Reports normal at home Continue with current regiment Serial growth and antenatal testing as per MFM guidelines  3. Diet controlled gestational diabetes mellitus (GDM) in second trimester Saw DM educator yesterday  Reviewed with pt  4. Previous classical cesarean delivery, antepartum condition or complication For repeat at 37 weeks  5. History of preterm delivery, currently pregnant Followed by term deliveries  6. GBS bacteriuria Tx while in labor  7. Language barrier Live interrupter used during today's visit  Preterm labor symptoms and general obstetric precautions including but not limited to vaginal bleeding, contractions, leaking of fluid and fetal movement were reviewed in detail with the patient. Please refer to After Visit Summary for other counseling recommendations.  Return in about 4 weeks (around 04/07/2023) for OB visit, face to face, MD only.   Hermina Staggers, MD

## 2023-03-10 NOTE — Progress Notes (Signed)
Patient was seen for Gestational Diabetes self-management on 03/10/2023 Start time 0815 and End time 0915   Estimated due date: 07/26/2022; [redacted]w[redacted]d  Aldo # [id number] AMN Interpreter SPANISH  Clinical: Medications: prenatal vitamin, labetalol Medical History: GDM A1c 5.9% 02/10/2023  Dietary and Lifestyle History: Patient lives with her children.  She is working 2 days per week at [employer]. She is unable to read or write.  Physical Activity: walks 3 days per week for 20-30 minutes Stress: working on improving this Sleep: sleep 5 hours per night (due to headaches and worry about spotting)  24 hr Recall:  First Meal:  green beans, eggs,  Snack:  rare Second meal:  fish, spaghetti, 1 tortilla Snack:  yogurt Third meal:  [employer] sandwich Snack:  none Beverages:  water, sweet tea, milk   NUTRITION INTERVENTION  Nutrition education (E-1) on the following topics:   Initial Follow-up  [x]  []  Definition of Gestational Diabetes [x]  []  Why dietary management is important in controlling blood glucose [x]  []  Effects each nutrient has on blood glucose levels [x]  []  Simple carbohydrates vs complex carbohydrates [x]  []  Fluid intake [x]  []  Creating a balanced meal plan []  []  Carbohydrate counting  [x]  []  When to check blood glucose levels [x]  []  Proper blood glucose monitoring techniques [x]  []  Effect of stress and stress reduction techniques  [x]  []  Exercise effect on blood glucose levels, appropriate exercise during pregnancy []  []  Importance of limiting caffeine and abstaining from alcohol and smoking [x]  []  Medications used for blood sugar control during pregnancy [x]  []  Hypoglycemia and rule of 15 [x]  []  Postpartum self care  Prodigy Blood Glucose Meter.  Patient has a meter prior to visit. She has not been using this correctly.  Instructed patient on use and patient was able to demonstrated.  Patient is to test pre breakfast and 2 hours after each meal. Postprandial: 98 today in  office  Patient instructed to monitor glucose levels: FBS: 60 - <= 95 mg/dL; 2 hour: <= 045 mg/dL  Patient received handouts:  Patient states that her daughter can help  Nutrition Diabetes and Pregnancy (Spanish) Glucose log  Patient will be seen for follow-up as needed.

## 2023-03-12 ENCOUNTER — Ambulatory Visit: Payer: Self-pay | Attending: Family Medicine

## 2023-03-12 ENCOUNTER — Ambulatory Visit: Payer: Self-pay | Admitting: *Deleted

## 2023-03-12 ENCOUNTER — Other Ambulatory Visit: Payer: Self-pay | Admitting: *Deleted

## 2023-03-12 ENCOUNTER — Other Ambulatory Visit: Payer: Self-pay | Admitting: Family Medicine

## 2023-03-12 VITALS — BP 154/85 | HR 72

## 2023-03-12 DIAGNOSIS — O10912 Unspecified pre-existing hypertension complicating pregnancy, second trimester: Secondary | ICD-10-CM

## 2023-03-12 DIAGNOSIS — O99212 Obesity complicating pregnancy, second trimester: Secondary | ICD-10-CM

## 2023-03-12 DIAGNOSIS — O0990 Supervision of high risk pregnancy, unspecified, unspecified trimester: Secondary | ICD-10-CM

## 2023-03-12 DIAGNOSIS — O34219 Maternal care for unspecified type scar from previous cesarean delivery: Secondary | ICD-10-CM

## 2023-03-12 DIAGNOSIS — O365920 Maternal care for other known or suspected poor fetal growth, second trimester, not applicable or unspecified: Secondary | ICD-10-CM

## 2023-03-12 DIAGNOSIS — O24419 Gestational diabetes mellitus in pregnancy, unspecified control: Secondary | ICD-10-CM

## 2023-03-12 DIAGNOSIS — O09522 Supervision of elderly multigravida, second trimester: Secondary | ICD-10-CM

## 2023-03-13 ENCOUNTER — Encounter: Payer: Self-pay | Admitting: Family Medicine

## 2023-03-13 DIAGNOSIS — O365990 Maternal care for other known or suspected poor fetal growth, unspecified trimester, not applicable or unspecified: Secondary | ICD-10-CM | POA: Insufficient documentation

## 2023-03-25 ENCOUNTER — Encounter: Payer: Self-pay | Admitting: *Deleted

## 2023-03-25 DIAGNOSIS — O99210 Obesity complicating pregnancy, unspecified trimester: Secondary | ICD-10-CM | POA: Insufficient documentation

## 2023-04-01 ENCOUNTER — Ambulatory Visit: Payer: Medicaid Other

## 2023-04-01 ENCOUNTER — Ambulatory Visit: Payer: Medicaid Other | Attending: Family Medicine

## 2023-04-06 ENCOUNTER — Other Ambulatory Visit: Payer: Self-pay

## 2023-04-06 ENCOUNTER — Inpatient Hospital Stay (HOSPITAL_COMMUNITY)
Admission: AD | Admit: 2023-04-06 | Discharge: 2023-04-10 | DRG: 806 | Disposition: A | Discharge status: Home / Self Care | Payer: Medicaid Other | Attending: Obstetrics & Gynecology | Admitting: Obstetrics & Gynecology

## 2023-04-06 ENCOUNTER — Inpatient Hospital Stay (HOSPITAL_BASED_OUTPATIENT_CLINIC_OR_DEPARTMENT_OTHER): Payer: Medicaid Other

## 2023-04-06 ENCOUNTER — Ambulatory Visit (INDEPENDENT_AMBULATORY_CARE_PROVIDER_SITE_OTHER): Payer: Self-pay | Admitting: Obstetrics and Gynecology

## 2023-04-06 ENCOUNTER — Encounter (HOSPITAL_COMMUNITY): Payer: Self-pay | Admitting: Obstetrics & Gynecology

## 2023-04-06 VITALS — BP 185/91 | HR 73 | Wt 161.0 lb

## 2023-04-06 DIAGNOSIS — Z758 Other problems related to medical facilities and other health care: Secondary | ICD-10-CM

## 2023-04-06 DIAGNOSIS — O1092 Unspecified pre-existing hypertension complicating childbirth: Secondary | ICD-10-CM | POA: Diagnosis present

## 2023-04-06 DIAGNOSIS — O09522 Supervision of elderly multigravida, second trimester: Secondary | ICD-10-CM

## 2023-04-06 DIAGNOSIS — O24419 Gestational diabetes mellitus in pregnancy, unspecified control: Secondary | ICD-10-CM | POA: Diagnosis present

## 2023-04-06 DIAGNOSIS — O1414 Severe pre-eclampsia complicating childbirth: Secondary | ICD-10-CM | POA: Diagnosis not present

## 2023-04-06 DIAGNOSIS — O34219 Maternal care for unspecified type scar from previous cesarean delivery: Secondary | ICD-10-CM

## 2023-04-06 DIAGNOSIS — Z603 Acculturation difficulty: Secondary | ICD-10-CM

## 2023-04-06 DIAGNOSIS — O10912 Unspecified pre-existing hypertension complicating pregnancy, second trimester: Secondary | ICD-10-CM | POA: Diagnosis not present

## 2023-04-06 DIAGNOSIS — O99212 Obesity complicating pregnancy, second trimester: Secondary | ICD-10-CM

## 2023-04-06 DIAGNOSIS — O365920 Maternal care for other known or suspected poor fetal growth, second trimester, not applicable or unspecified: Secondary | ICD-10-CM | POA: Diagnosis not present

## 2023-04-06 DIAGNOSIS — O364XX0 Maternal care for intrauterine death, not applicable or unspecified: Secondary | ICD-10-CM | POA: Diagnosis present

## 2023-04-06 DIAGNOSIS — O24410 Gestational diabetes mellitus in pregnancy, diet controlled: Secondary | ICD-10-CM

## 2023-04-06 DIAGNOSIS — O34211 Maternal care for low transverse scar from previous cesarean delivery: Secondary | ICD-10-CM | POA: Diagnosis not present

## 2023-04-06 DIAGNOSIS — R8271 Bacteriuria: Secondary | ICD-10-CM

## 2023-04-06 DIAGNOSIS — O09529 Supervision of elderly multigravida, unspecified trimester: Secondary | ICD-10-CM | POA: Insufficient documentation

## 2023-04-06 DIAGNOSIS — O09899 Supervision of other high risk pregnancies, unspecified trimester: Secondary | ICD-10-CM

## 2023-04-06 DIAGNOSIS — Z3A24 24 weeks gestation of pregnancy: Secondary | ICD-10-CM

## 2023-04-06 DIAGNOSIS — Z7982 Long term (current) use of aspirin: Secondary | ICD-10-CM | POA: Diagnosis not present

## 2023-04-06 DIAGNOSIS — E6689 Other obesity not elsewhere classified: Secondary | ICD-10-CM

## 2023-04-06 DIAGNOSIS — Z8759 Personal history of other complications of pregnancy, childbirth and the puerperium: Secondary | ICD-10-CM | POA: Insufficient documentation

## 2023-04-06 DIAGNOSIS — E669 Obesity, unspecified: Secondary | ICD-10-CM

## 2023-04-06 DIAGNOSIS — O112 Pre-existing hypertension with pre-eclampsia, second trimester: Secondary | ICD-10-CM | POA: Diagnosis not present

## 2023-04-06 DIAGNOSIS — O24420 Gestational diabetes mellitus in childbirth, diet controlled: Secondary | ICD-10-CM | POA: Diagnosis present

## 2023-04-06 DIAGNOSIS — O10919 Unspecified pre-existing hypertension complicating pregnancy, unspecified trimester: Secondary | ICD-10-CM

## 2023-04-06 DIAGNOSIS — R030 Elevated blood-pressure reading, without diagnosis of hypertension: Secondary | ICD-10-CM | POA: Diagnosis present

## 2023-04-06 DIAGNOSIS — O365930 Maternal care for other known or suspected poor fetal growth, third trimester, not applicable or unspecified: Secondary | ICD-10-CM | POA: Diagnosis present

## 2023-04-06 DIAGNOSIS — O365990 Maternal care for other known or suspected poor fetal growth, unspecified trimester, not applicable or unspecified: Secondary | ICD-10-CM | POA: Diagnosis present

## 2023-04-06 DIAGNOSIS — O10012 Pre-existing essential hypertension complicating pregnancy, second trimester: Secondary | ICD-10-CM

## 2023-04-06 DIAGNOSIS — O114 Pre-existing hypertension with pre-eclampsia, complicating childbirth: Secondary | ICD-10-CM | POA: Diagnosis present

## 2023-04-06 DIAGNOSIS — O0990 Supervision of high risk pregnancy, unspecified, unspecified trimester: Secondary | ICD-10-CM

## 2023-04-06 DIAGNOSIS — O99214 Obesity complicating childbirth: Secondary | ICD-10-CM | POA: Diagnosis present

## 2023-04-06 DIAGNOSIS — Z8632 Personal history of gestational diabetes: Secondary | ICD-10-CM | POA: Diagnosis present

## 2023-04-06 DIAGNOSIS — O321XX0 Maternal care for breech presentation, not applicable or unspecified: Secondary | ICD-10-CM | POA: Diagnosis present

## 2023-04-06 DIAGNOSIS — O99820 Streptococcus B carrier state complicating pregnancy: Secondary | ICD-10-CM | POA: Diagnosis not present

## 2023-04-06 DIAGNOSIS — O09212 Supervision of pregnancy with history of pre-term labor, second trimester: Secondary | ICD-10-CM

## 2023-04-06 LAB — COMPREHENSIVE METABOLIC PANEL
ALT: 30 U/L (ref 0–44)
AST: 30 U/L (ref 15–41)
Albumin: 2.9 g/dL — ABNORMAL LOW (ref 3.5–5.0)
Alkaline Phosphatase: 70 U/L (ref 38–126)
Anion gap: 7 (ref 5–15)
BUN: 10 mg/dL (ref 6–20)
CO2: 20 mmol/L — ABNORMAL LOW (ref 22–32)
Calcium: 8.9 mg/dL (ref 8.9–10.3)
Chloride: 108 mmol/L (ref 98–111)
Creatinine, Ser: 0.51 mg/dL (ref 0.44–1.00)
GFR, Estimated: >60 mL/min (ref >60)
Glucose, Bld: 84 mg/dL (ref 70–99)
Potassium: 3.6 mmol/L (ref 3.5–5.1)
Sodium: 135 mmol/L (ref 135–145)
Total Bilirubin: 0.3 mg/dL (ref 0.3–1.2)
Total Protein: 6.5 g/dL (ref 6.5–8.1)

## 2023-04-06 LAB — CBC
HCT: 39.9 % (ref 36.0–46.0)
Hemoglobin: 13.3 g/dL (ref 12.0–15.0)
MCH: 30 pg (ref 26.0–34.0)
MCHC: 33.3 g/dL (ref 30.0–36.0)
MCV: 89.9 fL (ref 80.0–100.0)
Platelets: 181 10*3/uL (ref 150–400)
RBC: 4.44 MIL/uL (ref 3.87–5.11)
RDW: 14.7 % (ref 11.5–15.5)
WBC: 8.1 10*3/uL (ref 4.0–10.5)
nRBC: 0 % (ref 0.0–0.2)

## 2023-04-06 LAB — PROTEIN / CREATININE RATIO, URINE
Creatinine, Urine: 97 mg/dL
Protein Creatinine Ratio: 1.61 mg/mg{creat} — ABNORMAL HIGH (ref 0.00–0.15)
Total Protein, Urine: 156 mg/dL

## 2023-04-06 LAB — TYPE AND SCREEN
ABO/RH(D): O POS
Antibody Screen: NEGATIVE

## 2023-04-06 LAB — GLUCOSE, CAPILLARY: Glucose-Capillary: 136 mg/dL — ABNORMAL HIGH (ref 70–99)

## 2023-04-06 MED ORDER — NIFEDIPINE ER OSMOTIC RELEASE 60 MG PO TB24
60.0000 mg | ORAL_TABLET | Freq: Every day | ORAL | 1 refills | Status: DC
Start: 2023-04-06 — End: 2023-04-06

## 2023-04-06 MED ORDER — ACETAMINOPHEN 500 MG PO TABS
1000.0000 mg | ORAL_TABLET | Freq: Once | ORAL | Status: AC
  Administered 2023-04-06: 1000 mg via ORAL
  Filled 2023-04-06: qty 2

## 2023-04-06 MED ORDER — LABETALOL HCL 5 MG/ML IV SOLN
40.0000 mg | INTRAVENOUS | Status: DC | PRN
  Administered 2023-04-08: 40 mg via INTRAVENOUS

## 2023-04-06 MED ORDER — PRENATAL MULTIVITAMIN CH
1.0000 | ORAL_TABLET | Freq: Every day | ORAL | Status: DC
  Administered 2023-04-07: 1 via ORAL
  Filled 2023-04-06: qty 1

## 2023-04-06 MED ORDER — HYDRALAZINE HCL 20 MG/ML IJ SOLN
10.0000 mg | INTRAMUSCULAR | Status: DC | PRN
  Administered 2023-04-08: 10 mg via INTRAVENOUS
  Filled 2023-04-06: qty 1

## 2023-04-06 MED ORDER — HYDRALAZINE HCL 20 MG/ML IJ SOLN: 10.0000 mg | INTRAMUSCULAR | Status: DC | PRN

## 2023-04-06 MED ORDER — LABETALOL HCL 5 MG/ML IV SOLN
20.0000 mg | INTRAVENOUS | Status: DC | PRN
  Administered 2023-04-06 – 2023-04-08 (×2): 20 mg via INTRAVENOUS
  Filled 2023-04-06 (×3): qty 4

## 2023-04-06 MED ORDER — MAGNESIUM SULFATE 40 GM/1000ML IV SOLN
2.0000 g/h | INTRAVENOUS | Status: AC
  Administered 2023-04-07: 2 g/h via INTRAVENOUS
  Filled 2023-04-06 (×2): qty 1000

## 2023-04-06 MED ORDER — LABETALOL HCL 100 MG PO TABS
400.0000 mg | ORAL_TABLET | Freq: Once | ORAL | Status: AC
  Administered 2023-04-06: 400 mg via ORAL
  Filled 2023-04-06: qty 4

## 2023-04-06 MED ORDER — LABETALOL HCL 200 MG PO TABS
600.0000 mg | ORAL_TABLET | Freq: Three times a day (TID) | ORAL | Status: DC
  Administered 2023-04-06 – 2023-04-07 (×3): 600 mg via ORAL
  Filled 2023-04-06 (×3): qty 3

## 2023-04-06 MED ORDER — LABETALOL HCL 5 MG/ML IV SOLN
20.0000 mg | INTRAVENOUS | Status: DC | PRN
  Administered 2023-04-08: 20 mg via INTRAVENOUS

## 2023-04-06 MED ORDER — DOCUSATE SODIUM 100 MG PO CAPS
100.0000 mg | ORAL_CAPSULE | Freq: Every day | ORAL | Status: DC
  Administered 2023-04-08: 100 mg via ORAL
  Filled 2023-04-06 (×2): qty 1

## 2023-04-06 MED ORDER — MAGNESIUM SULFATE BOLUS VIA INFUSION
4.0000 g | Freq: Once | INTRAVENOUS | Status: AC
  Administered 2023-04-06: 4 g via INTRAVENOUS
  Filled 2023-04-06: qty 1000

## 2023-04-06 MED ORDER — CALCIUM CARBONATE ANTACID 500 MG PO CHEW: 2.0000 | CHEWABLE_TABLET | ORAL | Status: DC | PRN

## 2023-04-06 MED ORDER — ASPIRIN 81 MG PO TBEC: 162.0000 mg | DELAYED_RELEASE_TABLET | Freq: Every day | ORAL | Status: DC

## 2023-04-06 MED ORDER — LACTATED RINGERS IV BOLUS
500.0000 mL | Freq: Once | INTRAVENOUS | Status: AC
  Administered 2023-04-07: 500 mL via INTRAVENOUS

## 2023-04-06 MED ORDER — LABETALOL HCL 5 MG/ML IV SOLN
80.0000 mg | INTRAVENOUS | Status: DC | PRN
  Administered 2023-04-06 – 2023-04-08 (×2): 80 mg via INTRAVENOUS

## 2023-04-06 MED ORDER — LABETALOL HCL 5 MG/ML IV SOLN
40.0000 mg | INTRAVENOUS | Status: DC | PRN
  Administered 2023-04-06 (×2): 40 mg via INTRAVENOUS
  Filled 2023-04-06 (×4): qty 8

## 2023-04-06 MED ORDER — ACETAMINOPHEN 325 MG PO TABS
650.0000 mg | ORAL_TABLET | ORAL | Status: DC | PRN
  Administered 2023-04-07: 650 mg via ORAL
  Filled 2023-04-06: qty 2

## 2023-04-06 MED ORDER — SODIUM CHLORIDE 0.9 % IV SOLN: INTRAVENOUS | Status: AC

## 2023-04-06 MED ORDER — LABETALOL HCL 5 MG/ML IV SOLN
80.0000 mg | INTRAVENOUS | Status: DC | PRN
  Administered 2023-04-06: 80 mg via INTRAVENOUS
  Filled 2023-04-06 (×3): qty 16

## 2023-04-06 NOTE — Consult Note (Incomplete)
[facility]  Consultation Service: Neonatology   Dr. Charlotta Newton has asked for consultation on [name] regarding the care of a severely growth restricted premature infant at [redacted] weeks gestation. Thank you for inviting [place] to see this patient.  This consultation was provided with the assistance of an in-person Spanish interpreter.  Reason for consult:  Explain the possible complications, the prognosis, and the care of a severely growth-restricted premature infant at 24w gestation.  Chief complaint: 42 year old female with a single IUP with an estimated weight of 277 grams on MFM scan today.   My key findings of this patient's HPI are:  I have reviewed the patient's chart and have met with her. The salient information is as follows:   Prenatal labs: O+ with neg antibody screen, HepB surface Ag negative, HIV negative, HCV negative, RPR non-reactive, rubella immune, GBS unknown.   Prenatal care:   good Pregnancy complications:  chronic HTN, Group B strep, gestational DM (diet controlled), severe fetal growth restriction and absent end-diastolic flow Maternal antibiotics: None Maternal Steroids: none   My recommendations for this patient and my actions included:   [name] are expecting a baby girl. They have not yet chosen a name.  With the assistance of an in-person Spanish interpreter, I spoke with [name] and her Glenn about their baby's current condition, including severe growth restriction and underdeveloped organs/systems, and signs of distress. I reviewed the current signs of placental insufficiency and that blood flow to the baby is not normal, compromising her growth and development. We discussed that the chance of survival at this baby's size is exceedingly low. I shared that given the baby's size, I do not recommend induction at this time for the sake of baby's potential extrauterine survival, given  that survival is not likely. I discussed that given [name]'s high blood pressure, should the pregnancy begin to put her own well-being in jeopardy, induction/delivery would occur in order to prevent her from having serious complications. Her baby may not survive delivery. If baby does survive delivery, she may be too small to intervene. I described the options of comfort care vs a trial of intensive care. I discussed the possibility that we may not be able to fit a breathing tube into her airway to be able to provide respiratory support. I discussed what a trial of intensive care would look like, including prolonged mechanical ventilation, need for IV nutrition and likely difficulty tolerating enteral feedings, and high risk of complications including intracranial bleeding (which would impart a risk of cerebral palsy and difficulty with basic skills/milestones), intestinal failure, ROP/visual deficits, and hearing deficits. I discussed that even if we provide all the care we can offer, their baby is unlikely to survive at this current size.  Father inquired about next steps and what can be done. We discussed that his wife's condition will continue to be monitored and treated ideally, if baby continues to be stable and his wife's blood pressures continue to respond to treatment/management, then serial growth scans can be done to follow for interval growth. I discussed that given the signs of placental insufficiency, I expect baby to grow very slowly and it may be difficult to achieve interval growth that would lead to improved survival, but that that would be the hope.  Kathryn Glenn is important to Kathryn Glenn and her Glenn, and they are hopeful that their daughter will be okay, but understand the challenges before them.  They acknowledge the difficult decision, weighing both their desires for her to survive but also the desire for her to live a good life in the future should she survive.  I  also shared with them, that should mother and baby remain stable to allow for further growth, and if the goal is for a trial of intensive care after delivery, I recommend a course of betamethasone. I discussed the potential side effects and that this is a balance of risks and benefits that her team will continue to discuss with her.  Of note, the family has a history of a preterm delivery in [place] 21 years ago -- available records (limited) indicate the baby was around 40 weeks' gestation, but father states their son was around 5 pounds and mother described his need for feeding support in the hospital. Their son is healthy now. I suspect he may have been late preterm, given the birth weight reported by father.   Final Impression:  [name] is a 42 year old female with cHTN and new diagnosis of pre-eclampsia with severe fetal growth restriction. Dopplers are abnormal and the fetus has intermittently shown distress with decelerations present. She and her Glenn are aware that a trial of intensive care may be pursued if she chooses and that the chance of survival with a trial of intensive care remains exceedingly low at this gestational age. They also have the option of comfort care and allowing the baby to have a natural death. She and her Glenn wish to discuss and consider these options.  ______________________________________________________________________  I discussed this consultation with [name] physician, Dr. Jolayne Panther.  Thank you for asking [place] to participate in the care of this patient. Please do not hesitate to contact us again if you are aware of any further ways we can be of assistance.   Jacob Moores, MD Neonatologist  I spent ~40 minutes in consultation time, of which 25 minutes was spent in direct face to face counseling.

## 2023-04-06 NOTE — Consult Note (Signed)
[facility]  Consultation Service: Neonatology   Dr. Charlotta Newton has asked for consultation on [name] regarding the care of a severely growth restricted premature infant at [redacted] weeks gestation. Thank you for inviting [place] to see this patient.  This consultation was provided with the assistance of an in-person Spanish interpreter.  Reason for consult:  Explain the possible complications, the prognosis, and the care of a severely growth-restricted premature infant at 24w gestation.  Chief complaint: 42 year old female with a single IUP with an estimated weight of 277 grams on MFM scan today.   My key findings of this patient's HPI are:  I have reviewed the patient's chart and have met with her. The salient information is as follows:   Prenatal labs: O+ with neg antibody screen, HepB surface Ag negative, HIV negative, HCV negative, RPR non-reactive, rubella immune, GBS unknown.   Prenatal care:   good Pregnancy complications:  chronic HTN, Group B strep, gestational DM (diet controlled), severe fetal growth restriction and absent end-diastolic flow Maternal antibiotics: None Maternal Steroids: none   My recommendations for this patient and my actions included:   [name] and her husband are expecting a baby girl. They have not yet chosen a name.  With the assistance of an in-person Spanish interpreter, I spoke with [name] and her husband about their baby's current condition, including severe growth restriction and underdeveloped organs/systems, and signs of distress. I reviewed the current signs of placental insufficiency and that blood flow to the baby is not normal, compromising her growth and development. We discussed that the baby is at risk for still birth. We discussed that the chance of survival at this baby's size is exceedingly low should she survive to delivery. I shared that given the baby's size, I do not recommend  induction at this time for the sake of baby's potential extrauterine survival, given that survival is not likely. I discussed that given [name]'s high blood pressure, should the pregnancy begin to put her own well-being in jeopardy, induction/delivery would occur in order to prevent her from having serious complications. Her baby may not survive the delivery process. If baby does survive delivery, she may be too small to intervene. I described the options of comfort care vs a trial of intensive care. I discussed the possibility that we may not be able to fit a breathing tube into her airway to be able to provide respiratory support. I discussed what a trial of intensive care would look like, including prolonged mechanical ventilation, need for IV nutrition and likely difficulty tolerating enteral feedings, and high risk of complications including intracranial bleeding (which would impart a risk of cerebral palsy and possible difficulty attaining basic skills/milestones), intestinal failure, ROP/visual deficits, and hearing deficits. I discussed that even if we provide all the care we can offer, their baby is unlikely to survive at this current size.  Father inquired about next steps and what can be done. We discussed that his wife's condition will continue to be monitored and treated, and that ideally, if baby continues to be stable and his wife's blood pressures continue to respond to treatment/management, then serial growth scans can be done to follow for interval growth. I discussed that given the signs of placental insufficiency, I expect baby to grow very slowly and it may be difficult to achieve interval growth that would lead to improved survival, but that that would be the hope. I again mentioned the converse possibility  of still birth.  I also shared with them, that should mother and baby remain stable to allow for further growth, and if the goal is for a trial of intensive care after  delivery, I recommend a course of betamethasone. I discussed the potential side effects and that this is a balance of risks and benefits that her team will continue to discuss with her pending her clinical course.  [name] is important to [name] and her husband, and they are hopeful that their daughter will be okay, but understand the challenges before them. They acknowledge the difficult decision, weighing both their desires for her to survive but also the desire for her to live a good life in the future should she survive.  Of note, the family has a history of a preterm delivery in [place] 21 years ago -- available records (limited) indicate the baby was around 7 weeks' gestation, but father states their son was around 5 pounds and mother described his need for feeding support in the hospital. Their son is healthy now. I suspect he may have been late preterm, given the birth weight reported by father.  Final Impression:  [name] is a 42 year old female with cHTN, severe fetal growth restriction, and new diagnosis of superimposed pre-eclampsia. Dopplers are abnormal and the fetus has intermittently shown distress with decelerations present. Mode of delivery will be by repeat cesarean, given a history of classical C-section and a second low-transverse C-section. She and her husband are aware that a trial of intensive care may be pursued if they choose and that the trial may not be successful; the chance of survival with a trial of intensive care remains exceedingly low at this combined gestational age and estimated weight. They also have the option of comfort care and allowing the baby to have a peaceful, natural death. She and her husband wish to discuss and consider these options together.   Finally, I shared with the couple that any decision they make now regarding desire for resuscitation can change in the future if circumstances change (e.g if they elect for comfort care  now but then there is a period of interval growth and the chances of survival become somewhat better).  The NICU team is available to attend delivery should the family desire an assessment at birth and/or trial of intensive care. ______________________________________________________________________  I discussed this consultation with [name]'s overnight physician, Dr. Jolayne Panther.  Thank you for asking [place] to participate in the care of this patient. Please do not hesitate to contact us again if you are aware of any further ways we can be of assistance.  Jacob Moores, MD Neonatologist  I spent ~40 minutes in consultation time, of which 30 minutes was spent in direct face to face counseling.

## 2023-04-06 NOTE — MAU Note (Addendum)
.  Kathryn Glenn is a 42 y.o. at [redacted]w[redacted]d here in MAU reporting: sent from [facility] office for HBP eval. She reports HA, eye pressure, and new swelling in her hands and her feet. She also reports scant amount of vaginal bleeding - has been wearing panti-liners.   Denies LOF. Reports +FM.  PIH Assessment: Headache present: Yes ;No treatment attempted Visual disturbances: Pressure on her eyes RUQ pain/Epigastric: None Atypical edema: ; BUE, ; BLE Hx of HBP: CHTN BP Medications: usually takes around 1130 -  hasn't taken yet  Onset of complaint: Today Pain score: 5/10 Vitals:   04/06/23 1204  BP: (!) 179/90  Pulse: 75  Resp: 18  Temp: 98.2 F (36.8 C)  SpO2: 99%     FHT:142 bpm Lab orders placed from triage:  UA

## 2023-04-06 NOTE — Progress Notes (Signed)
Patient reports LLQ pain similar to a period x3 days. Pain is constant. Also reports light bleeding, previously has been like spotting. Currently has a headache behind her eyes rated at a 5- no dizziness or blurry vision

## 2023-04-06 NOTE — H&P (Signed)
Chief Complaint: Hypertension   Event Date/Time   First Provider Initiated Contact with Patient 04/06/23 1222      SUBJECTIVE HPI: Kathryn Glenn is a 42 y.o. [id number] at [redacted]w[redacted]d by early ultrasound who presents to maternity admissions reporting elevated BP.  Patient presented to regularly scheduled OB visit this AM with Dr. Donavan Foil. BP found to be in severe range with mild HA. Patient sent over to MAU for further assessment. Notably patient had not taken AM dose of labetalol prior to appointment.  Patient notes tension headache across forehead. Amenable to tylenol. Denies vision changes, RUQ pain, swelling in extremities, CP, SOB. Feels well. Denies VB, LOF, DFM, change in vaginal discharge.  HPI  Past Medical History:  Diagnosis Date   Depression    Diabetes mellitus without complication (HCC)    No pertinent past medical history    Preterm labor    S/P cesarean section 08/13/2012   Past Surgical History:  Procedure Laterality Date   APPENDECTOMY     CESAREAN SECTION     C/S x 1   CESAREAN SECTION N/A 08/10/2012   Procedure: CESAREAN SECTION;  Surgeon: Catalina Antigua, MD;  Location: [facility];  Service: Obstetrics;  Laterality: N/A;  Repeat   Social History   Socioeconomic History   Marital status: Single    Spouse name: Not on file   Number of children: Not on file   Years of education: Not on file   Highest education level: Not on file  Occupational History   Not on file  Tobacco Use   Smoking status: Never   Smokeless tobacco: Never  Vaping Use   Vaping status: Never Used  Substance and Sexual Activity   Alcohol use: No   Drug use: No   Sexual activity: Not Currently    Birth control/protection: None  Other Topics Concern   Not on file  Social History Narrative   Not on file   Social Determinants of Health   Financial Resource Strain: Not on file  Food Insecurity: Not on file  Transportation Needs: Not on file  Physical Activity: Not on file  Stress:  Not on file  Social Connections: Not on file  Intimate Partner Violence: Not on file   No current facility-administered medications on file prior to encounter.   Current Outpatient Medications on File Prior to Encounter  Medication Sig Dispense Refill   aspirin EC 81 MG tablet Take 2 tablets (162 mg total) by mouth daily. Swallow whole. 100 tablet 5   labetalol (NORMODYNE) 200 MG tablet Take 2 tablets (400 mg total) by mouth 2 (two) times daily. 120 tablet 11   Blood Glucose Monitoring Suppl (ACCU-CHEK GUIDE) w/Device KIT 1 Device by Does not apply route in the morning, at noon, in the evening, and at bedtime. 1 kit 0   Prenatal Vit-Fe Fumarate-FA (MULTIVITAMIN-PRENATAL) 27-0.8 MG TABS tablet Take 1 tablet by mouth daily at 12 noon.     No Known Allergies  ROS:  Pertinent positives/negatives listed above.  I have reviewed patient's Past Medical Hx, Surgical Hx, Family Hx, Social Hx, medications and allergies.   Physical Exam  Patient Vitals for the past 24 hrs:  BP Temp Temp src Pulse Resp SpO2 Weight  04/06/23 1345 (!) 155/87 -- -- 65 -- 97 % --  04/06/23 1330 (!) 140/88 -- -- 64 -- 98 % --  04/06/23 1315 (!) 142/78 -- -- 66 -- 97 % --  04/06/23 1300 (!) 140/77 -- -- 71 -- 97 % --  04/06/23 1244 (!) 161/83 -- -- 67 -- -- --  04/06/23 1231 (!) 168/88 -- -- 67 -- -- --  04/06/23 1217 (!) 180/96 -- -- 69 -- -- --  04/06/23 1213 -- -- -- -- -- -- 73.9 kg  04/06/23 1204 (!) 179/90 98.2 F (36.8 C) Oral 75 18 99 % --   Constitutional: Well-developed, well-nourished female in no acute distress Cardiovascular: normal rate Respiratory: normal effort GI: Abd soft, non-tender. Pos BS x 4 MS: Extremities nontender, no edema, normal ROM Neurologic: Alert and oriented x 4  FHT:  Baseline 120 , moderate variability, accelerations 10x10 present, variable decelerations Contractions: none  LAB RESULTS Results for orders placed or performed during the hospital encounter of 04/06/23 (from  the past 24 hour(s))  CBC     Status: None   Collection Time: 04/06/23 11:54 AM  Result Value Ref Range   WBC 8.1 4.0 - 10.5 K/uL   RBC 4.44 3.87 - 5.11 MIL/uL   Hemoglobin 13.3 12.0 - 15.0 g/dL   HCT 95.1 88.4 - 16.6 %   MCV 89.9 80.0 - 100.0 fL   MCH 30.0 26.0 - 34.0 pg   MCHC 33.3 30.0 - 36.0 g/dL   RDW 06.3 01.6 - 01.0 %   Platelets 181 150 - 400 K/uL   nRBC 0.0 0.0 - 0.2 %  Comprehensive metabolic panel     Status: Abnormal   Collection Time: 04/06/23 11:54 AM  Result Value Ref Range   Sodium 135 135 - 145 mmol/L   Potassium 3.6 3.5 - 5.1 mmol/L   Chloride 108 98 - 111 mmol/L   CO2 20 (L) 22 - 32 mmol/L   Glucose, Bld 84 70 - 99 mg/dL   BUN 10 6 - 20 mg/dL   Creatinine, Ser 9.32 0.44 - 1.00 mg/dL   Calcium 8.9 8.9 - 35.5 mg/dL   Total Protein 6.5 6.5 - 8.1 g/dL   Albumin 2.9 (L) 3.5 - 5.0 g/dL   AST 30 15 - 41 U/L   ALT 30 0 - 44 U/L   Alkaline Phosphatase 70 38 - 126 U/L   Total Bilirubin 0.3 0.3 - 1.2 mg/dL   GFR, Estimated >73 >22 mL/min   Anion gap 7 5 - 15    O/Positive/-- (08/20 1622)  IMAGING [place] MFM OB DETAIL +14 WK  Result Date: 03/12/2023 ----------------------------------------------------------------------  OBSTETRICS REPORT                       (Signed Final 03/12/2023 05:18 pm) ---------------------------------------------------------------------- Patient Info  ID #:       [id number]                          D.O.B.:  [birth year] (41 yrs)  Name:       Kathryn Glenn                    Visit Date: 03/12/2023 12:24 pm              Glenn ---------------------------------------------------------------------- Performed By  Attending:        Noralee Space MD        Ref. Address:     [address]  Performed By:     Reinaldo Raddle            [facility]  Referred By:      Venora Maples MD ---------------------------------------------------------------------- Orders  #  Description                           Code        Ordered By  1  [place] MFM OB DETAIL +14 WK               76811.01    MATTHEW ECKSTAT  2  [place] MFM UA CORD DOPPLER                76820.02    MATTHEW ECKSTAT ----------------------------------------------------------------------  #  Order #                     Accession #                Episode #  1  [id number]                   [id number]                 [id number]  2  [id number]                   [id number]                 [id number] ---------------------------------------------------------------------- Indications  Hypertension - Chronic/Pre-existing            O10.019  (Labetalol)  Advanced maternal age multigravida 70+,        O15.522  second trimester (41)  Obesity complicating pregnancy, second         O99.212  trimester (BMI 31)  History of cesarean delivery, currently        O34.219  pregnant  Poor obstetric history (prior pre-term deliveryO09.219  at 28 weeks)  [redacted] weeks gestation of pregnancy                Z3A.20  Encounter for antenatal screening for          Z36.3  malformations  LR NIPS, Neg Horizon ---------------------------------------------------------------------- Fetal Evaluation  Num Of Fetuses:         1  Fetal Heart Rate(bpm):  140  Cardiac Activity:       Observed  Presentation:           Variable  Placenta:  Anterior  P. Cord Insertion:      Visualized, central  Amniotic Fluid  AFI FV:      Within normal limits                              Largest Pocket(cm)                              4.52 ---------------------------------------------------------------------- Biometry  BPD:      39.5  mm     G. Age:  18w 0d        < 1  %    CI:        68.16   %    70 - 86                                                          FL/HC:      14.2   %    16.8 - 19.8  HC:       153   mm     G. Age:   18w 2d        < 1  %    HC/AC:      1.34        1.09 - 1.39  AC:      114.1  mm     G. Age:  17w 1d        < 1  %    FL/BPD:     54.9   %  FL:       21.7  mm     G. Age:  16w 3d        < 1  %    FL/AC:      19.0   %    20 - 24  CER:      19.5  mm     G. Age:  19w 0d         19  %  LV:        3.1  mm  CM:        4.6  mm  Est. FW:     178  gm      0 lb 6 oz    < 1  % ---------------------------------------------------------------------- OB History  Gravidity:    7         Term:   4  TOP:          1       Ectopic:  1        Living: 4 ---------------------------------------------------------------------- Gestational Age  Clinical EDD:  20w 3d                                        EDD:   07/27/23  U/S Today:     17w 3d                                        EDD:   08/17/23  Best:  20w 3d     Det. By:  Clinical EDD             EDD:   07/27/23 ---------------------------------------------------------------------- Targeted Anatomy  Central Nervous System  Calvarium/Cranial V.:  Appears normal         Cereb./Vermis:          Appears normal  Cavum:                 Appears normal         Cisterna Magna:         Appears normal  Lateral Ventricles:    Appears normal         Midline Falx:           Appears normal  Choroid Plexus:        Appears normal  Spine  Cervical:              Limited                Sacral:                 Limited  Thoracic:              Limited                Shape/Curvature:        Appears normal  Lumbar:                Limited  Head/Neck  Lips:                  Not well visualized    Profile:                Appears normal  Neck:                  Not well visualized    Orbits/Eyes:            Appears normal  Nuchal Fold:           Appears normal         Mandible:               Appears normal  Nasal Bone:            Present                Maxilla:                Appears normal  Thorax  4 Chamber View:        Not well visualized    Interventr. Septum:     Appears normal  Cardiac Rhythm:        Normal                  Cardiac Axis:           Normal  Cardiac Situs:         Appears normal         Diaphragm:              Appears normal  Rt Outflow Tract:      Not well visualized    3 Vessel View:          Not well visualized  Lt Outflow Tract:      Not well visualized    3 V Trachea View:       Appears normal  Aortic Arch:           Appears  normal         IVC:                    Appears normal  Ductal Arch:           Not well visualized    Crossing:               Not well visualized  SVC:                   Appears normal  Abdomen  Ventral Wall:          Appears normal         Lt Kidney:              Appears normal  Cord Insertion:        Appears normal         Rt Kidney:              Appears normal  Situs:                 Appears normal         Bladder:                Appears normal  Stomach:               Appears normal  Extremities  Lt Humerus:            Appears normal         Lt Femur:               Appears normal  Rt Humerus:            Appears normal         Rt Femur:               Appears normal  Lt Forearm:            Appears normal         Lt Lower Leg:           Appears normal  Rt Forearm:            Appears normal         Rt Lower Leg:           Appears normal  Lt Hand:               Open hand nml          Lt Foot:                Nml foot  Rt Hand:               Open hand nml          Rt Foot:                Nml foot  Other  Umbilical Cord:        Normal 3-vessel        Genitalia:              Female-nml ---------------------------------------------------------------------- Cervix Uterus Adnexa  Cervix  Length:            3.2  cm.  Normal appearance by transabdominal scan  Uterus  No abnormality visualized.  Right Ovary  Size(cm)     2.75   x   1.36   x  1.32      Vol(ml): 2.58  Within normal limits.  Left Ovary  Size(cm)  1.9  x   1.98   x  1.79      Vol(ml): 3.53  Within normal limits.  Cul De Sac  No free fluid seen.  Adnexa  No adnexal mass visualized  ---------------------------------------------------------------------- Impression  G7 P3124.  Patient is here for fetal anatomy scan.  I  explained ultrasound findings and counseled the patient with  help of Spanish language interpreter present to the room.  Advanced maternal age.  On cell-free fetal DNA screening,  the risks of fetal aneuploidies are not increased.  Her pregnancy is well dated by 6-week ultrasound.  Obstetric history significant for 2 term vaginal deliveries  followed by a preterm classical cesarean delivery and,  subsequently, a term cesarean delivery (2014) at [redacted] weeks  gestation.  She has chronic hypertension and takes labetalol 400 mg  twice daily.  She has gestational diabetes and takes metformin.  She  reports both her fasting and postprandial levels are within  normal range.  We performed a fetal anatomical survey.  Fetal biometry lags  gestational age by 3 weeks.  The estimated fetal weight is at  the 1st percentile.  Head circumference, abdominal  circumference and femur length measurements are at the  first percentiles.  Amniotic fluid is normal and good fetal  activity seen.  Fetal anatomical survey appears normal but  limited by smaller fetus in fetal position.  Umbilical artery Doppler showed persistent absent end-  diastolic flow.  Impression: Severe fetal growth restriction.  I explained the finding of severe fetal growth restriction.  I  explained that her gestational age calculated from early  ultrasound is very accurate.  I discussed the possible causes  including placental insufficiency (most common cause), fetal  chromosomal anomalies or genetic syndromes, and fetal  infection (less likely).  Patient does not give history of fever or rashes.  Hypertension can lead to placental insufficiency and some  pregnancies.  I discussed the limitations of cell free fetal DNA screening in  detecting fetal chromosomal anomalies.  I explained only  amniocentesis will give a definitive result  on the fetal  karyotype.  I explained amniocentesis procedure and  possible complication of miscarriage (1 and 500 procedures).  Patient will discuss with the family and decide.  I discussed abnormal Doppler studies and increased the  likelihood of fetal demise.  No intervention will be attempted  till adequate fetal weight (500 g and above) is attained.  I encouraged the patient to take her blood pressure  medications regularly and get a blood pressure cuff to  measure blood pressures at home.  Blood pressure today at  our office is 154/85.  Patient does not have symptoms of  severe features of preeclampsia. ---------------------------------------------------------------------- Recommendations  -An appointment was made for her to return in 3 weeks for  fetal growth assessment and umbilical artery Doppler studies.  -I encouraged her to contact your office or come to the MAU  if she perceives decreased fetal movements. ----------------------------------------------------------------------                 Noralee Space, MD Electronically Signed Final Report   03/12/2023 05:18 pm ----------------------------------------------------------------------   [place] MFM UA CORD DOPPLER  Result Date: 03/12/2023 ----------------------------------------------------------------------  OBSTETRICS REPORT                       (Signed Final 03/12/2023 05:18 pm) ---------------------------------------------------------------------- Patient Info  ID #:       [id number]  D.O.B.:  [birth year] (41 yrs)  Name:       Kathryn Glenn                    Visit Date: 03/12/2023 12:24 pm              Glenn ---------------------------------------------------------------------- Performed By  Attending:        Noralee Space MD        Ref. Address:     [address]  Performed By:     Reinaldo Raddle            [facility]  Referred By:      Venora Maples MD ---------------------------------------------------------------------- Orders  #  Description                           Code        Ordered By  1  [place] MFM OB DETAIL +14 WK               76811.01    MATTHEW ECKSTAT  2  [place] MFM UA CORD DOPPLER                76820.02    MATTHEW ECKSTAT ----------------------------------------------------------------------  #  Order #                     Accession #                Episode #  1  [id number]                   [id number]                 [id number]  2  [id number]                   [id number]                 [id number] ---------------------------------------------------------------------- Indications  Hypertension - Chronic/Pre-existing            O10.019  (Labetalol)  Advanced maternal age multigravida 66+,        O26.522  second trimester (41)  Obesity complicating pregnancy, second  G64.403  trimester (BMI 31)  History of cesarean delivery, currently        O34.219  pregnant  Poor obstetric history (prior pre-term deliveryO09.219  at 28 weeks)  [redacted] weeks gestation of pregnancy                Z3A.20  Encounter for antenatal screening for          Z36.3  malformations  LR NIPS, Neg Horizon ---------------------------------------------------------------------- Fetal Evaluation  Num Of Fetuses:         1  Fetal Heart Rate(bpm):  140  Cardiac Activity:       Observed  Presentation:           Variable  Placenta:               Anterior  P. Cord Insertion:      Visualized, central  Amniotic Fluid  AFI FV:      Within normal limits                              Largest Pocket(cm)                              4.52 ---------------------------------------------------------------------- Biometry  BPD:      39.5  mm     G. Age:  18w 0d        < 1  %    CI:        68.16   %    70 - 86                                                           FL/HC:      14.2   %    16.8 - 19.8  HC:       153   mm     G. Age:  18w 2d        < 1  %    HC/AC:      1.34        1.09 - 1.39  AC:      114.1  mm     G. Age:  17w 1d        < 1  %    FL/BPD:     54.9   %  FL:       21.7  mm     G. Age:  16w 3d        < 1  %    FL/AC:      19.0   %    20 - 24  CER:      19.5  mm     G. Age:  19w 0d         19  %  LV:        3.1  mm  CM:        4.6  mm  Est. FW:     178  gm      0 lb 6 oz    < 1  % ---------------------------------------------------------------------- OB History  Gravidity:    7         Term:   4  TOP:  1       Ectopic:  1        Living: 4 ---------------------------------------------------------------------- Gestational Age  Clinical EDD:  20w 3d                                        EDD:   07/27/23  U/S Today:     17w 3d                                        EDD:   08/17/23  Best:          20w 3d     Det. By:  Clinical EDD             EDD:   07/27/23 ---------------------------------------------------------------------- Targeted Anatomy  Central Nervous System  Calvarium/Cranial V.:  Appears normal         Cereb./Vermis:          Appears normal  Cavum:                 Appears normal         Cisterna Magna:         Appears normal  Lateral Ventricles:    Appears normal         Midline Falx:           Appears normal  Choroid Plexus:        Appears normal  Spine  Cervical:              Limited                Sacral:                 Limited  Thoracic:              Limited                Shape/Curvature:        Appears normal  Lumbar:                Limited  Head/Neck  Lips:                  Not well visualized    Profile:                Appears normal  Neck:                  Not well visualized    Orbits/Eyes:            Appears normal  Nuchal Fold:           Appears normal         Mandible:               Appears normal  Nasal Bone:            Present                Maxilla:                Appears normal  Thorax  4 Chamber  View:        Not well visualized    Interventr. Septum:     Appears normal  Cardiac Rhythm:        Normal  Cardiac Axis:           Normal  Cardiac Situs:         Appears normal         Diaphragm:              Appears normal  Rt Outflow Tract:      Not well visualized    3 Vessel View:          Not well visualized  Lt Outflow Tract:      Not well visualized    3 V Trachea View:       Appears normal  Aortic Arch:           Appears normal         IVC:                    Appears normal  Ductal Arch:           Not well visualized    Crossing:               Not well visualized  SVC:                   Appears normal  Abdomen  Ventral Wall:          Appears normal         Lt Kidney:              Appears normal  Cord Insertion:        Appears normal         Rt Kidney:              Appears normal  Situs:                 Appears normal         Bladder:                Appears normal  Stomach:               Appears normal  Extremities  Lt Humerus:            Appears normal         Lt Femur:               Appears normal  Rt Humerus:            Appears normal         Rt Femur:               Appears normal  Lt Forearm:            Appears normal         Lt Lower Leg:           Appears normal  Rt Forearm:            Appears normal         Rt Lower Leg:           Appears normal  Lt Hand:               Open hand nml          Lt Foot:                Nml foot  Rt Hand:               Open hand nml          Rt Foot:  Nml foot  Other  Umbilical Cord:        Normal 3-vessel        Genitalia:              Female-nml ---------------------------------------------------------------------- Cervix Uterus Adnexa  Cervix  Length:            3.2  cm.  Normal appearance by transabdominal scan  Uterus  No abnormality visualized.  Right Ovary  Size(cm)     2.75   x   1.36   x  1.32      Vol(ml): 2.58  Within normal limits.  Left Ovary  Size(cm)       1.9  x   1.98   x  1.79      Vol(ml): 3.53  Within normal limits.  Cul De  Sac  No free fluid seen.  Adnexa  No adnexal mass visualized ---------------------------------------------------------------------- Impression  G7 P3124.  Patient is here for fetal anatomy scan.  I  explained ultrasound findings and counseled the patient with  help of Spanish language interpreter present to the room.  Advanced maternal age.  On cell-free fetal DNA screening,  the risks of fetal aneuploidies are not increased.  Her pregnancy is well dated by 6-week ultrasound.  Obstetric history significant for 2 term vaginal deliveries  followed by a preterm classical cesarean delivery and,  subsequently, a term cesarean delivery (2014) at [redacted] weeks  gestation.  She has chronic hypertension and takes labetalol 400 mg  twice daily.  She has gestational diabetes and takes metformin.  She  reports both her fasting and postprandial levels are within  normal range.  We performed a fetal anatomical survey.  Fetal biometry lags  gestational age by 3 weeks.  The estimated fetal weight is at  the 1st percentile.  Head circumference, abdominal  circumference and femur length measurements are at the  first percentiles.  Amniotic fluid is normal and good fetal  activity seen.  Fetal anatomical survey appears normal but  limited by smaller fetus in fetal position.  Umbilical artery Doppler showed persistent absent end-  diastolic flow.  Impression: Severe fetal growth restriction.  I explained the finding of severe fetal growth restriction.  I  explained that her gestational age calculated from early  ultrasound is very accurate.  I discussed the possible causes  including placental insufficiency (most common cause), fetal  chromosomal anomalies or genetic syndromes, and fetal  infection (less likely).  Patient does not give history of fever or rashes.  Hypertension can lead to placental insufficiency and some  pregnancies.  I discussed the limitations of cell free fetal DNA screening in  detecting fetal chromosomal anomalies.  I  explained only  amniocentesis will give a definitive result on the fetal  karyotype.  I explained amniocentesis procedure and  possible complication of miscarriage (1 and 500 procedures).  Patient will discuss with the family and decide.  I discussed abnormal Doppler studies and increased the  likelihood of fetal demise.  No intervention will be attempted  till adequate fetal weight (500 g and above) is attained.  I encouraged the patient to take her blood pressure  medications regularly and get a blood pressure cuff to  measure blood pressures at home.  Blood pressure today at  our office is 154/85.  Patient does not have symptoms of  severe features of preeclampsia. ---------------------------------------------------------------------- Recommendations  -An appointment was made for her to return in 3 weeks for  fetal growth assessment and umbilical  artery Doppler studies.  -I encouraged her to contact your office or come to the MAU  if she perceives decreased fetal movements. ----------------------------------------------------------------------                 Noralee Space, MD Electronically Signed Final Report   03/12/2023 05:18 pm ----------------------------------------------------------------------    MAU Management/MDM: Orders Placed This Encounter  Procedures   CBC   Comprehensive metabolic panel   Protein / creatinine ratio, urine   Notify physician (specify) Confirmatory reading of BP> 160/110 15 minutes later   Apply Hypertensive Disorders of Pregnancy Care Plan   Measure blood pressure    Meds ordered this encounter  Medications   AND Linked Order Group    labetalol (NORMODYNE) injection 20 mg    labetalol (NORMODYNE) injection 40 mg    labetalol (NORMODYNE) injection 80 mg    hydrALAZINE (APRESOLINE) injection 10 mg   labetalol (NORMODYNE) tablet 400 mg   acetaminophen (TYLENOL) tablet 1,000 mg    Concern for superimposed preE on cHTN. PreE labs obtained. Gave AM labetalol dose and  followed labetalol protocol (received IV 20, 40, 80 mg doses) to decrease Bps to a mild level. CBC, CMP without s/sx of preE. P:C less than her baseline earlier in pregnancy. Headache resolved with tylenol.  Initially was going to discharge patient with increased BP regimen. However, FHT became increasing non-reassuring with progressively recurrent variables and down-trending baseline. Decision made to obtain growth U/S and dopplers. These were non-reassuring as noted above. Will admit to antepartum per MFM recommendations.  ASSESSMENT Chronic hypertension during pregnancy, antepartum  [redacted] weeks gestation of pregnancy  Poor fetal growth affecting management of mother in second trimester, single or unspecified fetus  Diet controlled gestational diabetes mellitus (GDM) in second trimester  History of preterm delivery, currently pregnant  Previous classical cesarean delivery, antepartum condition or complication  Supervision of high risk pregnancy, antepartum  Language barrier  GBS bacteriuria  Other obesity affecting pregnancy in second trimester  Multigravida of advanced maternal age in second trimester   PLAN  Admit to antepartum for management of underlying maternal conditions in light of severely growth-restricted babe and non-reassuring dopplers. Please see Dr. Lawana Chambers note for complete details.    Wylene Simmer, MD OB Fellow 04/06/2023  2:02 PM

## 2023-04-06 NOTE — MAU Provider Note (Incomplete)
Chief Complaint: Hypertension   Event Date/Time   First Provider Initiated Contact with Patient 04/06/23 1222      SUBJECTIVE HPI: Katelyn Broadnax is a 42 y.o. [id number] at [redacted]w[redacted]d by early ultrasound who presents to maternity admissions reporting elevated BP.  Patient presented to regularly scheduled OB visit this AM with Dr. Donavan Foil. BP found to be in severe range with mild HA. Patient sent over to MAU for further assessment. Notably patient had not taken AM dose of labetalol prior to appointment.  Patient notes tension headache across forehead. Amenable to tylenol. Denies vision changes, RUQ pain, swelling in extremities, CP, SOB. Feels well. Denies VB, LOF, DFM, change in vaginal discharge.  HPI  Past Medical History:  Diagnosis Date   Depression    Diabetes mellitus without complication (HCC)    No pertinent past medical history    Preterm labor    S/P cesarean section 08/13/2012   Past Surgical History:  Procedure Laterality Date   APPENDECTOMY     CESAREAN SECTION     C/S x 1   CESAREAN SECTION N/A 08/10/2012   Procedure: CESAREAN SECTION;  Surgeon: Catalina Antigua, MD;  Location: [facility];  Service: Obstetrics;  Laterality: N/A;  Repeat   Social History   Socioeconomic History   Marital status: Single    Spouse name: Not on file   Number of children: Not on file   Years of education: Not on file   Highest education level: Not on file  Occupational History   Not on file  Tobacco Use   Smoking status: Never   Smokeless tobacco: Never  Vaping Use   Vaping status: Never Used  Substance and Sexual Activity   Alcohol use: No   Drug use: No   Sexual activity: Not Currently    Birth control/protection: None  Other Topics Concern   Not on file  Social History Narrative   Not on file   Social Determinants of Health   Financial Resource Strain: Not on file  Food Insecurity: Not on file  Transportation Needs: Not on file  Physical Activity: Not on file  Stress:  Not on file  Social Connections: Not on file  Intimate Partner Violence: Not on file   No current facility-administered medications on file prior to encounter.   Current Outpatient Medications on File Prior to Encounter  Medication Sig Dispense Refill   aspirin EC 81 MG tablet Take 2 tablets (162 mg total) by mouth daily. Swallow whole. 100 tablet 5   labetalol (NORMODYNE) 200 MG tablet Take 2 tablets (400 mg total) by mouth 2 (two) times daily. 120 tablet 11   Blood Glucose Monitoring Suppl (ACCU-CHEK GUIDE) w/Device KIT 1 Device by Does not apply route in the morning, at noon, in the evening, and at bedtime. 1 kit 0   Prenatal Vit-Fe Fumarate-FA (MULTIVITAMIN-PRENATAL) 27-0.8 MG TABS tablet Take 1 tablet by mouth daily at 12 noon.     No Known Allergies  ROS:  Pertinent positives/negatives listed above.  I have reviewed patient's Past Medical Hx, Surgical Hx, Family Hx, Social Hx, medications and allergies.   Physical Exam  Patient Vitals for the past 24 hrs:  BP Temp Temp src Pulse Resp SpO2 Weight  04/06/23 1345 (!) 155/87 -- -- 65 -- 97 % --  04/06/23 1330 (!) 140/88 -- -- 64 -- 98 % --  04/06/23 1315 (!) 142/78 -- -- 66 -- 97 % --  04/06/23 1300 (!) 140/77 -- -- 71 -- 97 % --  04/06/23 1244 (!) 161/83 -- -- 67 -- -- --  04/06/23 1231 (!) 168/88 -- -- 67 -- -- --  04/06/23 1217 (!) 180/96 -- -- 69 -- -- --  04/06/23 1213 -- -- -- -- -- -- 73.9 kg  04/06/23 1204 (!) 179/90 98.2 F (36.8 C) Oral 75 18 99 % --   Constitutional: Well-developed, well-nourished female in no acute distress Cardiovascular: normal rate Respiratory: normal effort GI: Abd soft, non-tender. Pos BS x 4 MS: Extremities nontender, no edema, normal ROM Neurologic: Alert and oriented x 4  FHT:  Baseline 120 , moderate variability, accelerations 10x10 present, variable decelerations Contractions: none  LAB RESULTS Results for orders placed or performed during the hospital encounter of 04/06/23 (from  the past 24 hour(s))  CBC     Status: None   Collection Time: 04/06/23 11:54 AM  Result Value Ref Range   WBC 8.1 4.0 - 10.5 K/uL   RBC 4.44 3.87 - 5.11 MIL/uL   Hemoglobin 13.3 12.0 - 15.0 g/dL   HCT 40.9 81.1 - 91.4 %   MCV 89.9 80.0 - 100.0 fL   MCH 30.0 26.0 - 34.0 pg   MCHC 33.3 30.0 - 36.0 g/dL   RDW 78.2 95.6 - 21.3 %   Platelets 181 150 - 400 K/uL   nRBC 0.0 0.0 - 0.2 %  Comprehensive metabolic panel     Status: Abnormal   Collection Time: 04/06/23 11:54 AM  Result Value Ref Range   Sodium 135 135 - 145 mmol/L   Potassium 3.6 3.5 - 5.1 mmol/L   Chloride 108 98 - 111 mmol/L   CO2 20 (L) 22 - 32 mmol/L   Glucose, Bld 84 70 - 99 mg/dL   BUN 10 6 - 20 mg/dL   Creatinine, Ser 0.86 0.44 - 1.00 mg/dL   Calcium 8.9 8.9 - 57.8 mg/dL   Total Protein 6.5 6.5 - 8.1 g/dL   Albumin 2.9 (L) 3.5 - 5.0 g/dL   AST 30 15 - 41 U/L   ALT 30 0 - 44 U/L   Alkaline Phosphatase 70 38 - 126 U/L   Total Bilirubin 0.3 0.3 - 1.2 mg/dL   GFR, Estimated >46 >96 mL/min   Anion gap 7 5 - 15    O/Positive/-- (08/20 1622)  IMAGING [place] MFM OB DETAIL +14 WK  Result Date: 03/12/2023 ----------------------------------------------------------------------  OBSTETRICS REPORT                       (Signed Final 03/12/2023 05:18 pm) ---------------------------------------------------------------------- Patient Info  ID #:       [id number]                          D.O.B.:  December 18, 1980 (41 yrs)  Name:       Antonietta Breach                    Visit Date: 03/12/2023 12:24 pm              [name] ---------------------------------------------------------------------- Performed By  Attending:        Noralee Space MD        Ref. Address:     [address]  Performed By:     Reinaldo Raddle            [facility]  Referred By:      Venora Maples MD ---------------------------------------------------------------------- Orders  #  Description                           Code        Ordered By  1  [place] MFM OB DETAIL +14 WK               76811.01    MATTHEW ECKSTAT  2  [place] MFM UA CORD DOPPLER                76820.02    MATTHEW ECKSTAT ----------------------------------------------------------------------  #  Order #                     Accession #                Episode #  1  [id number]                   [id number]                 [id number]  2  [id number]                   [id number]                 [id number] ---------------------------------------------------------------------- Indications  Hypertension - Chronic/Pre-existing            O10.019  (Labetalol)  Advanced maternal age multigravida 51+,        O33.522  second trimester (41)  Obesity complicating pregnancy, second         O99.212  trimester (BMI 31)  History of cesarean delivery, currently        O34.219  pregnant  Poor obstetric history (prior pre-term deliveryO09.219  at 28 weeks)  [redacted] weeks gestation of pregnancy                Z3A.20  Encounter for antenatal screening for          Z36.3  malformations  LR NIPS, Neg Horizon ---------------------------------------------------------------------- Fetal Evaluation  Num Of Fetuses:         1  Fetal Heart Rate(bpm):  140  Cardiac Activity:       Observed  Presentation:           Variable  Placenta:  Anterior  P. Cord Insertion:      Visualized, central  Amniotic Fluid  AFI FV:      Within normal limits                              Largest Pocket(cm)                              4.52 ---------------------------------------------------------------------- Biometry  BPD:      39.5  mm     G. Age:  18w 0d        < 1  %    CI:        68.16   %    70 - 86                                                          FL/HC:      14.2   %    16.8 - 19.8  HC:       153   mm     G. Age:   18w 2d        < 1  %    HC/AC:      1.34        1.09 - 1.39  AC:      114.1  mm     G. Age:  17w 1d        < 1  %    FL/BPD:     54.9   %  FL:       21.7  mm     G. Age:  16w 3d        < 1  %    FL/AC:      19.0   %    20 - 24  CER:      19.5  mm     G. Age:  19w 0d         19  %  LV:        3.1  mm  CM:        4.6  mm  Est. FW:     178  gm      0 lb 6 oz    < 1  % ---------------------------------------------------------------------- OB History  Gravidity:    7         Term:   4  TOP:          1       Ectopic:  1        Living: 4 ---------------------------------------------------------------------- Gestational Age  Clinical EDD:  20w 3d                                        EDD:   07/27/23  U/S Today:     17w 3d                                        EDD:   08/17/23  Best:  20w 3d     Det. By:  Clinical EDD             EDD:   07/27/23 ---------------------------------------------------------------------- Targeted Anatomy  Central Nervous System  Calvarium/Cranial V.:  Appears normal         Cereb./Vermis:          Appears normal  Cavum:                 Appears normal         Cisterna Magna:         Appears normal  Lateral Ventricles:    Appears normal         Midline Falx:           Appears normal  Choroid Plexus:        Appears normal  Spine  Cervical:              Limited                Sacral:                 Limited  Thoracic:              Limited                Shape/Curvature:        Appears normal  Lumbar:                Limited  Head/Neck  Lips:                  Not well visualized    Profile:                Appears normal  Neck:                  Not well visualized    Orbits/Eyes:            Appears normal  Nuchal Fold:           Appears normal         Mandible:               Appears normal  Nasal Bone:            Present                Maxilla:                Appears normal  Thorax  4 Chamber View:        Not well visualized    Interventr. Septum:     Appears normal  Cardiac Rhythm:        Normal                  Cardiac Axis:           Normal  Cardiac Situs:         Appears normal         Diaphragm:              Appears normal  Rt Outflow Tract:      Not well visualized    3 Vessel View:          Not well visualized  Lt Outflow Tract:      Not well visualized    3 V Trachea View:       Appears normal  Aortic Arch:           Appears  normal         IVC:                    Appears normal  Ductal Arch:           Not well visualized    Crossing:               Not well visualized  SVC:                   Appears normal  Abdomen  Ventral Wall:          Appears normal         Lt Kidney:              Appears normal  Cord Insertion:        Appears normal         Rt Kidney:              Appears normal  Situs:                 Appears normal         Bladder:                Appears normal  Stomach:               Appears normal  Extremities  Lt Humerus:            Appears normal         Lt Femur:               Appears normal  Rt Humerus:            Appears normal         Rt Femur:               Appears normal  Lt Forearm:            Appears normal         Lt Lower Leg:           Appears normal  Rt Forearm:            Appears normal         Rt Lower Leg:           Appears normal  Lt Hand:               Open hand nml          Lt Foot:                Nml foot  Rt Hand:               Open hand nml          Rt Foot:                Nml foot  Other  Umbilical Cord:        Normal 3-vessel        Genitalia:              Female-nml ---------------------------------------------------------------------- Cervix Uterus Adnexa  Cervix  Length:            3.2  cm.  Normal appearance by transabdominal scan  Uterus  No abnormality visualized.  Right Ovary  Size(cm)     2.75   x   1.36   x  1.32      Vol(ml): 2.58  Within normal limits.  Left Ovary  Size(cm)  1.9  x   1.98   x  1.79      Vol(ml): 3.53  Within normal limits.  Cul De Sac  No free fluid seen.  Adnexa  No adnexal mass visualized  ---------------------------------------------------------------------- Impression  G7 P3124.  Patient is here for fetal anatomy scan.  I  explained ultrasound findings and counseled the patient with  help of Spanish language interpreter present to the room.  Advanced maternal age.  On cell-free fetal DNA screening,  the risks of fetal aneuploidies are not increased.  Her pregnancy is well dated by 6-week ultrasound.  Obstetric history significant for 2 term vaginal deliveries  followed by a preterm classical cesarean delivery and,  subsequently, a term cesarean delivery (2014) at [redacted] weeks  gestation.  She has chronic hypertension and takes labetalol 400 mg  twice daily.  She has gestational diabetes and takes metformin.  She  reports both her fasting and postprandial levels are within  normal range.  We performed a fetal anatomical survey.  Fetal biometry lags  gestational age by 3 weeks.  The estimated fetal weight is at  the 1st percentile.  Head circumference, abdominal  circumference and femur length measurements are at the  first percentiles.  Amniotic fluid is normal and good fetal  activity seen.  Fetal anatomical survey appears normal but  limited by smaller fetus in fetal position.  Umbilical artery Doppler showed persistent absent end-  diastolic flow.  Impression: Severe fetal growth restriction.  I explained the finding of severe fetal growth restriction.  I  explained that her gestational age calculated from early  ultrasound is very accurate.  I discussed the possible causes  including placental insufficiency (most common cause), fetal  chromosomal anomalies or genetic syndromes, and fetal  infection (less likely).  Patient does not give history of fever or rashes.  Hypertension can lead to placental insufficiency and some  pregnancies.  I discussed the limitations of cell free fetal DNA screening in  detecting fetal chromosomal anomalies.  I explained only  amniocentesis will give a definitive result  on the fetal  karyotype.  I explained amniocentesis procedure and  possible complication of miscarriage (1 and 500 procedures).  Patient will discuss with the family and decide.  I discussed abnormal Doppler studies and increased the  likelihood of fetal demise.  No intervention will be attempted  till adequate fetal weight (500 g and above) is attained.  I encouraged the patient to take her blood pressure  medications regularly and get a blood pressure cuff to  measure blood pressures at home.  Blood pressure today at  our office is 154/85.  Patient does not have symptoms of  severe features of preeclampsia. ---------------------------------------------------------------------- Recommendations  -An appointment was made for her to return in 3 weeks for  fetal growth assessment and umbilical artery Doppler studies.  -I encouraged her to contact your office or come to the MAU  if she perceives decreased fetal movements. ----------------------------------------------------------------------                 Noralee Space, MD Electronically Signed Final Report   03/12/2023 05:18 pm ----------------------------------------------------------------------   [place] MFM UA CORD DOPPLER  Result Date: 03/12/2023 ----------------------------------------------------------------------  OBSTETRICS REPORT                       (Signed Final 03/12/2023 05:18 pm) ---------------------------------------------------------------------- Patient Info  ID #:       [id number]  D.O.B.:  21-Jan-1981 (41 yrs)  Name:       EDEN TOOHEY                    Visit Date: 03/12/2023 12:24 pm              [name] ---------------------------------------------------------------------- Performed By  Attending:        Noralee Space MD        Ref. Address:     [address]  Performed By:     Reinaldo Raddle            Location[facility]  Referred By:      Venora Maples MD ---------------------------------------------------------------------- Orders  #  Description                           Code        Ordered By  1  [place] MFM OB DETAIL +14 WK               76811.01    MATTHEW ECKSTAT  2  [place] MFM UA CORD DOPPLER                76820.02    MATTHEW ECKSTAT ----------------------------------------------------------------------  #  Order #                     Accession #                Episode #  1  [id number]                   [id number]                 [id number]  2  [id number]                   [id number]                 [id number] ---------------------------------------------------------------------- Indications  Hypertension - Chronic/Pre-existing            O10.019  (Labetalol)  Advanced maternal age multigravida 57+,        O27.522  second trimester (41)  Obesity complicating pregnancy, second  V25.366  trimester (BMI 31)  History of cesarean delivery, currently        O34.219  pregnant  Poor obstetric history (prior pre-term deliveryO09.219  at 28 weeks)  [redacted] weeks gestation of pregnancy                Z3A.20  Encounter for antenatal screening for          Z36.3  malformations  LR NIPS, Neg Horizon ---------------------------------------------------------------------- Fetal Evaluation  Num Of Fetuses:         1  Fetal Heart Rate(bpm):  140  Cardiac Activity:       Observed  Presentation:           Variable  Placenta:               Anterior  P. Cord Insertion:      Visualized, central  Amniotic Fluid  AFI FV:      Within normal limits                              Largest Pocket(cm)                              4.52 ---------------------------------------------------------------------- Biometry  BPD:      39.5  mm     G. Age:  18w 0d        < 1  %    CI:        68.16   %    70 - 86                                                           FL/HC:      14.2   %    16.8 - 19.8  HC:       153   mm     G. Age:  18w 2d        < 1  %    HC/AC:      1.34        1.09 - 1.39  AC:      114.1  mm     G. Age:  17w 1d        < 1  %    FL/BPD:     54.9   %  FL:       21.7  mm     G. Age:  16w 3d        < 1  %    FL/AC:      19.0   %    20 - 24  CER:      19.5  mm     G. Age:  19w 0d         19  %  LV:        3.1  mm  CM:        4.6  mm  Est. FW:     178  gm      0 lb 6 oz    < 1  % ---------------------------------------------------------------------- OB History  Gravidity:    7         Term:   4  TOP:  1       Ectopic:  1        Living: 4 ---------------------------------------------------------------------- Gestational Age  Clinical EDD:  20w 3d                                        EDD:   07/27/23  U/S Today:     17w 3d                                        EDD:   08/17/23  Best:          20w 3d     Det. By:  Clinical EDD             EDD:   07/27/23 ---------------------------------------------------------------------- Targeted Anatomy  Central Nervous System  Calvarium/Cranial V.:  Appears normal         Cereb./Vermis:          Appears normal  Cavum:                 Appears normal         Cisterna Magna:         Appears normal  Lateral Ventricles:    Appears normal         Midline Falx:           Appears normal  Choroid Plexus:        Appears normal  Spine  Cervical:              Limited                Sacral:                 Limited  Thoracic:              Limited                Shape/Curvature:        Appears normal  Lumbar:                Limited  Head/Neck  Lips:                  Not well visualized    Profile:                Appears normal  Neck:                  Not well visualized    Orbits/Eyes:            Appears normal  Nuchal Fold:           Appears normal         Mandible:               Appears normal  Nasal Bone:            Present                Maxilla:                Appears normal  Thorax  4 Chamber  View:        Not well visualized    Interventr. Septum:     Appears normal  Cardiac Rhythm:        Normal  Cardiac Axis:           Normal  Cardiac Situs:         Appears normal         Diaphragm:              Appears normal  Rt Outflow Tract:      Not well visualized    3 Vessel View:          Not well visualized  Lt Outflow Tract:      Not well visualized    3 V Trachea View:       Appears normal  Aortic Arch:           Appears normal         IVC:                    Appears normal  Ductal Arch:           Not well visualized    Crossing:               Not well visualized  SVC:                   Appears normal  Abdomen  Ventral Wall:          Appears normal         Lt Kidney:              Appears normal  Cord Insertion:        Appears normal         Rt Kidney:              Appears normal  Situs:                 Appears normal         Bladder:                Appears normal  Stomach:               Appears normal  Extremities  Lt Humerus:            Appears normal         Lt Femur:               Appears normal  Rt Humerus:            Appears normal         Rt Femur:               Appears normal  Lt Forearm:            Appears normal         Lt Lower Leg:           Appears normal  Rt Forearm:            Appears normal         Rt Lower Leg:           Appears normal  Lt Hand:               Open hand nml          Lt Foot:                Nml foot  Rt Hand:               Open hand nml          Rt Foot:  Nml foot  Other  Umbilical Cord:        Normal 3-vessel        Genitalia:              Female-nml ---------------------------------------------------------------------- Cervix Uterus Adnexa  Cervix  Length:            3.2  cm.  Normal appearance by transabdominal scan  Uterus  No abnormality visualized.  Right Ovary  Size(cm)     2.75   x   1.36   x  1.32      Vol(ml): 2.58  Within normal limits.  Left Ovary  Size(cm)       1.9  x   1.98   x  1.79      Vol(ml): 3.53  Within normal limits.  Cul De  Sac  No free fluid seen.  Adnexa  No adnexal mass visualized ---------------------------------------------------------------------- Impression  G7 P3124.  Patient is here for fetal anatomy scan.  I  explained ultrasound findings and counseled the patient with  help of Spanish language interpreter present to the room.  Advanced maternal age.  On cell-free fetal DNA screening,  the risks of fetal aneuploidies are not increased.  Her pregnancy is well dated by 6-week ultrasound.  Obstetric history significant for 2 term vaginal deliveries  followed by a preterm classical cesarean delivery and,  subsequently, a term cesarean delivery (2014) at [redacted] weeks  gestation.  She has chronic hypertension and takes labetalol 400 mg  twice daily.  She has gestational diabetes and takes metformin.  She  reports both her fasting and postprandial levels are within  normal range.  We performed a fetal anatomical survey.  Fetal biometry lags  gestational age by 3 weeks.  The estimated fetal weight is at  the 1st percentile.  Head circumference, abdominal  circumference and femur length measurements are at the  first percentiles.  Amniotic fluid is normal and good fetal  activity seen.  Fetal anatomical survey appears normal but  limited by smaller fetus in fetal position.  Umbilical artery Doppler showed persistent absent end-  diastolic flow.  Impression: Severe fetal growth restriction.  I explained the finding of severe fetal growth restriction.  I  explained that her gestational age calculated from early  ultrasound is very accurate.  I discussed the possible causes  including placental insufficiency (most common cause), fetal  chromosomal anomalies or genetic syndromes, and fetal  infection (less likely).  Patient does not give history of fever or rashes.  Hypertension can lead to placental insufficiency and some  pregnancies.  I discussed the limitations of cell free fetal DNA screening in  detecting fetal chromosomal anomalies.  I  explained only  amniocentesis will give a definitive result on the fetal  karyotype.  I explained amniocentesis procedure and  possible complication of miscarriage (1 and 500 procedures).  Patient will discuss with the family and decide.  I discussed abnormal Doppler studies and increased the  likelihood of fetal demise.  No intervention will be attempted  till adequate fetal weight (500 g and above) is attained.  I encouraged the patient to take her blood pressure  medications regularly and get a blood pressure cuff to  measure blood pressures at home.  Blood pressure today at  our office is 154/85.  Patient does not have symptoms of  severe features of preeclampsia. ---------------------------------------------------------------------- Recommendations  -An appointment was made for her to return in 3 weeks for  fetal growth assessment and umbilical  artery Doppler studies.  -I encouraged her to contact your office or come to the MAU  if she perceives decreased fetal movements. ----------------------------------------------------------------------                 Noralee Space, MD Electronically Signed Final Report   03/12/2023 05:18 pm ----------------------------------------------------------------------    MAU Management/MDM: Orders Placed This Encounter  Procedures   CBC   Comprehensive metabolic panel   Protein / creatinine ratio, urine   Notify physician (specify) Confirmatory reading of BP> 160/110 15 minutes later   Apply Hypertensive Disorders of Pregnancy Care Plan   Measure blood pressure    Meds ordered this encounter  Medications   AND Linked Order Group    labetalol (NORMODYNE) injection 20 mg    labetalol (NORMODYNE) injection 40 mg    labetalol (NORMODYNE) injection 80 mg    hydrALAZINE (APRESOLINE) injection 10 mg   labetalol (NORMODYNE) tablet 400 mg   acetaminophen (TYLENOL) tablet 1,000 mg    Concern for superimposed preE on cHTN. PreE labs obtained. Gave AM labetalol dose and  followed labetalol protocol (received IV 20, 40, 80 mg doses).  ***  ASSESSMENT 1. Chronic hypertension during pregnancy, antepartum   2. [redacted] weeks gestation of pregnancy     PLAN Discharge home with strict return precautions. Allergies as of 04/06/2023   No Known Allergies   Med Rec must be completed prior to using this [facility]***        Wylene Simmer, MD OB Fellow 04/06/2023  2:02 PM

## 2023-04-06 NOTE — MAU Note (Signed)
Patient planned for discharge. This RN communicated with Unit Director that RN was not comfortable with discharge of patient due to FHR tracing. Director suggested to initiate chain of command and speak with Dr. Crissie Reese, MD. This RN communicated with Dr. Crissie Reese and voiced concerns due to FHR tracing. Dr. Crissie Reese, MD, informed Wylene Simmer, MD, to call Dr. Charlotta Newton to evaluate fetal tracing. Dr. Charlotta Newton, MD, to unit for evaluation.

## 2023-04-06 NOTE — Progress Notes (Signed)
   PRENATAL VISIT NOTE  Subjective:  Kathryn Glenn is a 42 y.o. [id number] at [redacted]w[redacted]d being seen today for ongoing prenatal care.  She is currently monitored for the following issues for this high-risk pregnancy and has History of preterm delivery, currently pregnant; Previous classical cesarean delivery, antepartum condition or complication; History of postpartum depression; Supervision of high risk pregnancy, antepartum; Prediabetes; GDM (gestational diabetes mellitus); GBS bacteriuria; Chronic hypertension during pregnancy, antepartum; Language barrier; IUGR (intrauterine growth restriction) affecting care of mother; Pregnancy complicated by obesity; and Advanced maternal age in multigravida on their problem list.  Patient doing well with no acute concerns today. She reports headache and cramping .  Contractions: Not present. Vag. Bleeding: Small.  Movement: Present. Denies leaking of fluid.   The following portions of the patient's history were reviewed and updated as appropriate: allergies, current medications, past family history, past medical history, past social history, past surgical history and problem list. Problem list updated.  Objective:   Vitals:   04/06/23 1030  BP: (!) 185/91  Pulse: 73  Weight: 161 lb (73 kg)    Fetal Status: Fetal Heart Rate (bpm): 130 Fundal Height: 23 cm Movement: Present     General:  Alert, oriented and cooperative. Patient is in no acute distress.  Skin: Skin is warm and dry. No rash noted.   Cardiovascular: Normal heart rate noted  Respiratory: Normal respiratory effort, no problems with respiration noted  Abdomen: Soft, gravid, appropriate for gestational age.  Pain/Pressure: Present     Pelvic: Cervical exam deferred        Extremities: Normal range of motion.  Edema: None  Mental Status:  Normal mood and affect. Normal behavior. Normal judgment and thought content.   Assessment and Plan:  Pregnancy: [id number] at [redacted]w[redacted]d  1. Supervision of  high risk pregnancy, antepartum Continue routine prenatal care Pt advised to be more aware of her BP and blood sugars  2. Chronic hypertension during pregnancy, antepartum Pt takes her labetalol late in the morning, she was advised to take it around 9-930 at the latest and before her OB visits BP excessively high on no meds, with recent history of 3 days of headache and FGR, will send to MAU for labs a, serial BP and eval  3. Diet controlled gestational diabetes mellitus (GDM) in second trimester FBS: 92 PPBS: 78 Pt did not bring in blood sugars, she was advised they are necessary for adequate management 4. GBS bacteriuria Treat in labor  5. History of preterm delivery, currently pregnant No s/sx of preterm labor  6. Language barrier Live interpreter utilized  7. Previous classical cesarean delivery, antepartum condition or complication Delivery by c section at 36-37 weeks  8. Poor fetal growth affecting management of mother in second trimester, single or unspecified fetus Pt missed her follow up growth scan and dopplers, these have been rescheduled  9. Multigravida of advanced maternal age in second trimester   Preterm labor symptoms and general obstetric precautions including but not limited to vaginal bleeding, contractions, leaking of fluid and fetal movement were reviewed in detail with the patient.  Please refer to After Visit Summary for other counseling recommendations.   Return in about 3 weeks (around 04/27/2023) for [facility], in person, 3rd trim labs.   Mariel Aloe, MD Faculty Attending [facility]

## 2023-04-07 ENCOUNTER — Inpatient Hospital Stay (HOSPITAL_COMMUNITY): Payer: Medicaid Other

## 2023-04-07 DIAGNOSIS — Z3A24 24 weeks gestation of pregnancy: Secondary | ICD-10-CM

## 2023-04-07 DIAGNOSIS — O112 Pre-existing hypertension with pre-eclampsia, second trimester: Secondary | ICD-10-CM

## 2023-04-07 DIAGNOSIS — O09522 Supervision of elderly multigravida, second trimester: Secondary | ICD-10-CM

## 2023-04-07 DIAGNOSIS — O365920 Maternal care for other known or suspected poor fetal growth, second trimester, not applicable or unspecified: Secondary | ICD-10-CM

## 2023-04-07 DIAGNOSIS — O10919 Unspecified pre-existing hypertension complicating pregnancy, unspecified trimester: Secondary | ICD-10-CM

## 2023-04-07 DIAGNOSIS — O34219 Maternal care for unspecified type scar from previous cesarean delivery: Secondary | ICD-10-CM

## 2023-04-07 DIAGNOSIS — O3680X0 Pregnancy with inconclusive fetal viability, not applicable or unspecified: Secondary | ICD-10-CM

## 2023-04-07 DIAGNOSIS — O24410 Gestational diabetes mellitus in pregnancy, diet controlled: Secondary | ICD-10-CM

## 2023-04-07 LAB — COMPREHENSIVE METABOLIC PANEL
ALT: 31 U/L (ref 0–44)
AST: 31 U/L (ref 15–41)
Albumin: 2.6 g/dL — ABNORMAL LOW (ref 3.5–5.0)
Alkaline Phosphatase: 63 U/L (ref 38–126)
Anion gap: 9 (ref 5–15)
BUN: 11 mg/dL (ref 6–20)
CO2: 19 mmol/L — ABNORMAL LOW (ref 22–32)
Calcium: 7 mg/dL — ABNORMAL LOW (ref 8.9–10.3)
Chloride: 107 mmol/L (ref 98–111)
Creatinine, Ser: 0.52 mg/dL (ref 0.44–1.00)
GFR, Estimated: >60 mL/min (ref >60)
Glucose, Bld: 132 mg/dL — ABNORMAL HIGH (ref 70–99)
Potassium: 3.6 mmol/L (ref 3.5–5.1)
Sodium: 135 mmol/L (ref 135–145)
Total Bilirubin: 0.3 mg/dL (ref 0.3–1.2)
Total Protein: 5.7 g/dL — ABNORMAL LOW (ref 6.5–8.1)

## 2023-04-07 LAB — GLUCOSE, CAPILLARY
Glucose-Capillary: 107 mg/dL — ABNORMAL HIGH (ref 70–99)
Glucose-Capillary: 181 mg/dL — ABNORMAL HIGH (ref 70–99)
Glucose-Capillary: 127 mg/dL — ABNORMAL HIGH (ref 70–99)
Glucose-Capillary: 123 mg/dL — ABNORMAL HIGH (ref 70–99)

## 2023-04-07 LAB — CBC
HCT: 34.3 % — ABNORMAL LOW (ref 36.0–46.0)
Hemoglobin: 11.8 g/dL — ABNORMAL LOW (ref 12.0–15.0)
MCH: 31.2 pg (ref 26.0–34.0)
MCHC: 34.4 g/dL (ref 30.0–36.0)
MCV: 90.7 fL (ref 80.0–100.0)
Platelets: 161 10*3/uL (ref 150–400)
RBC: 3.78 MIL/uL — ABNORMAL LOW (ref 3.87–5.11)
RDW: 14.9 % (ref 11.5–15.5)
WBC: 8.8 10*3/uL (ref 4.0–10.5)
nRBC: 0 % (ref 0.0–0.2)

## 2023-04-07 LAB — MAGNESIUM: Magnesium: 5 mg/dL — ABNORMAL HIGH (ref 1.7–2.4)

## 2023-04-07 MED ORDER — LABETALOL HCL 200 MG PO TABS
400.0000 mg | ORAL_TABLET | Freq: Three times a day (TID) | ORAL | Status: DC
  Administered 2023-04-07 – 2023-04-09 (×5): 400 mg via ORAL
  Filled 2023-04-07 (×5): qty 2

## 2023-04-07 NOTE — Consult Note (Signed)
Maternal-Fetal Medicine   Name: Kathryn Glenn DOB: July 21, 1980 MRN: [id number] Referring Provider: Myna Hidalgo, DO  I had the pleasure of seeing [name] today at the [facility]. She is G7 P3124 at 24w 1d gestation admitted yesterday with severe-range blood pressures. At her prenatal visit yesterday, her blood pressure was 185/91 mm Hg. Patient was taking labetalol 400 mg twice daily and reports she was taking her antihypertensives regularly. At MAU, she received IV labetalol to control acute hypertension. She gives history of headaches, pain in the eyes, no right upper quadrant pain. She gives history of vaginal bleeding (spotting).  At admission, labetalol was increased to 600 mg tid and later reduced to 400 mg tid. Her blood pressure are now in the normal range.   On ultrasound performed yesterday, the fetal weight was 277 grams and reversed-end-diastolic flow was seen. Interval weight over 4 weeks was only 99 grams.   Patient had neonatology consultation. She was counseled on the poor prognosis and unlikelihood of postnatal survival if the baby is delivered now.  Prenatal course: On cell-free fetal DNA screening, the risks of fetal aneuploidies are not increased. She has gestational diabetes and reports normal glucose levels on home blood glucose monitoring.  Obstetric history 2001: Term vaginal delivery in [place] of a female infant weighing 8-8 at birth. 2002: Term vaginal delivery in [place] of a female infant weighing 7-3 at birth. 2005: At 73 weeks' gestation, she had classical cesarean delivery of a female infant (weight not known) for suspected fetal growth restriction (?). Her son is in good health now. 2014: Term elective cesarean delivery ([place]) of a female infant weighing 5-9 at birth.  Allergies: NKDA.  P/E: Patient is comfortably sitting up; not in distress. Vitals stable; BP 119/74 mm Hg. HEENT: Normal; no lymphadenopathy. Abdomen: Soft; no  tenderness. No flank tenderness. Pedal edema present.  Labs: Hb 13.2, Hct 39.9, WBC 8.1, PLT 181. Electrolytes normal, AST 30, ALT 30, creatinine 0.51. Protein/creatinine ratio 1.6 (baseline 0.27).  I counseled the patient with help of Spanish language interpreter present in the room. Our concerns include: Chronic hypertension with possible superimposed preeclampsia I explained that sudden onset of severe-range blood pressure may be from inadequate treatment. Patient has not been checking her blood pressures at home and may have had unrecognized severe-range hypertension. I explained the significance of increased protein/creatinine ratio and severe hypertension both raising the possibility of superimposed preeclampsia.  Maternal complications including stroke were discussed.  We discussed antihypertensives and the importance of choosing the correct dosage to control blood pressure. Inpatient management till control of blood pressure is ideal.  I counseled the patient that delivery will be recommended if maternal complications arise or if blood pressures are not controlled with adequate antihypertensives.  Severe fetal growth restriction I discussed severe growth restriction and poor interval growth. Most-likely cause is placental insufficiency. I explained the significance of umbilical artery Doppler studies with help of diagrams. Reversed-end-diastolic flow is associated with increased risk of fetal demise. Patient was made aware of a high-likelihood of stillbirth. I recommended expectant management now and deliver only for maternal indications. Delivery for fetal indications will lead to neonatal death. Delivery will be by cesarean section that is associated with maternal complications with poor neonatal survival.  Vaginal delivery may be considered if delivery is indicated for maternal reasons.  I discussed the option of expectant management with weekly ultrasound to check fetal heart, UA  Doppler, and amniotic fluid volume. Fetal growth will be performed  in 3 weeks and if good fetal weight (compatible with postnatal survival) is seen, antenatal corticosteroids can be given.  After counseling, the patient opted for no intervention for fetal reasons. She preferred outpatient management with weekly ultrasound and decide after next fetal growth assessment.  Gestational diabetes Briefly discussed management. Metformin or insulin should be considered if diabetes is not well controlled with diet.  Recommendations -Inpatient management for 48 hours to ensure adequate control of blood pressure. Consider discharge on Thursday (04/09/23). -[facility] at [facility]. We will set up appointment before discharge. -Patient to report to the MAU if she has decreased fetal movements. -Continue labetalol. -Consultation with diabetic educator. -Avoid NST. Fetal heart check twice daily. -No intervention for fetal reasons. -If maternal complications arise and delivery is decided, patient should be counseled and given the option of vaginal delivery.  Thank you for consultation.  If you have any questions or concerns, please contact me the [facility].  Consultation including face-to-face counseling 60 minutes.

## 2023-04-07 NOTE — Progress Notes (Signed)
Patient ID: Kathryn Glenn, female   DOB: 03-31-1981, 42 y.o.   MRN: [id number] FACULTY PRACTICE ANTEPARTUM(COMPREHENSIVE) NOTE  Kathryn Glenn is a 42 y.o. Kathryn Glenn at [redacted]w[redacted]d who is admitted for severe pre-eclampsia .    Fetal presentation is breech. Length of Stay:  1  Days  Date of admission:04/06/2023  Subjective: Patient reports feeling dizzy with blurred vision since starting magnesium last night Patient reports the fetal movement as active. Patient reports uterine contraction  activity as none. Patient reports  vaginal bleeding as none. Patient describes fluid per vagina as None.  Vitals:  Blood pressure 109/71, pulse 68, temperature 97.8 F (36.6 C), temperature source Oral, resp. rate 17, weight 73.9 kg, SpO2 97%, unknown if currently breastfeeding. Vitals:   04/07/23 0510 04/07/23 0600 04/07/23 0655 04/07/23 0722  BP: 109/71     Pulse: 68     Resp: 16 16 18 17   Temp: 97.8 F (36.6 C)     TempSrc: Oral     SpO2: 97%     Weight:       Physical Examination: GENERAL: Well-developed, well-nourished female in no acute distress.  LUNGS: Clear to auscultation bilaterally.  HEART: Regular rate and rhythm. ABDOMEN: Soft, nontender, nondistended. No organomegaly. PELVIC: Not indicated EXTREMITIES: No cyanosis, clubbing, or edema, 2+ distal pulses.   Fetal Monitoring: Baseline 100;s, min variability, no accels, no decels Labs:  Results for orders placed or performed during the hospital encounter of 04/06/23 (from the past 24 hour(s))  CBC   Collection Time: 04/06/23 11:54 AM  Result Value Ref Range   WBC 8.1 4.0 - 10.5 K/uL   RBC 4.44 3.87 - 5.11 MIL/uL   Hemoglobin 13.3 12.0 - 15.0 g/dL   HCT 14.7 82.9 - 56.2 %   MCV 89.9 80.0 - 100.0 fL   MCH 30.0 26.0 - 34.0 pg   MCHC 33.3 30.0 - 36.0 g/dL   RDW 13.0 86.5 - 78.4 %   Platelets 181 150 - 400 K/uL   nRBC 0.0 0.0 - 0.2 %  Comprehensive metabolic panel   Collection Time: 04/06/23 11:54 AM  Result Value  Ref Range   Sodium 135 135 - 145 mmol/L   Potassium 3.6 3.5 - 5.1 mmol/L   Chloride 108 98 - 111 mmol/L   CO2 20 (L) 22 - 32 mmol/L   Glucose, Bld 84 70 - 99 mg/dL   BUN 10 6 - 20 mg/dL   Creatinine, Ser 6.96 0.44 - 1.00 mg/dL   Calcium 8.9 8.9 - 29.5 mg/dL   Total Protein 6.5 6.5 - 8.1 g/dL   Albumin 2.9 (L) 3.5 - 5.0 g/dL   AST 30 15 - 41 U/L   ALT 30 0 - 44 U/L   Alkaline Phosphatase 70 38 - 126 U/L   Total Bilirubin 0.3 0.3 - 1.2 mg/dL   GFR, Estimated >28 >41 mL/min   Anion gap 7 5 - 15  Protein / creatinine ratio, urine   Collection Time: 04/06/23 12:34 PM  Result Value Ref Range   Creatinine, Urine 97 mg/dL   Total Protein, Urine 156 mg/dL   Protein Creatinine Ratio 1.61 (H) 0.00 - 0.15 mg/mg[Cre]  Type and screen [facility]   Collection Time: 04/06/23  5:29 PM  Result Value Ref Range   ABO/RH(D) O POS    Antibody Screen NEG    Sample Expiration      04/09/2023,2359 Performed at [facility], [address], [place], [place] [address]  Glucose, capillary   Collection Time: 04/06/23 10:42 PM  Result Value Ref Range   Glucose-Capillary 136 (H) 70 - 99 mg/dL  CBC   Collection Time: 04/07/23  4:10 AM  Result Value Ref Range   WBC 8.8 4.0 - 10.5 K/uL   RBC 3.78 (L) 3.87 - 5.11 MIL/uL   Hemoglobin 11.8 (L) 12.0 - 15.0 g/dL   HCT 16.1 (L) 09.6 - 04.5 %   MCV 90.7 80.0 - 100.0 fL   MCH 31.2 26.0 - 34.0 pg   MCHC 34.4 30.0 - 36.0 g/dL   RDW 40.9 81.1 - 91.4 %   Platelets 161 150 - 400 K/uL   nRBC 0.0 0.0 - 0.2 %  Comprehensive metabolic panel   Collection Time: 04/07/23  4:10 AM  Result Value Ref Range   Sodium 135 135 - 145 mmol/L   Potassium 3.6 3.5 - 5.1 mmol/L   Chloride 107 98 - 111 mmol/L   CO2 19 (L) 22 - 32 mmol/L   Glucose, Bld 132 (H) 70 - 99 mg/dL   BUN 11 6 - 20 mg/dL   Creatinine, Ser 7.82 0.44 - 1.00 mg/dL   Calcium 7.0 (L) 8.9 - 10.3 mg/dL   Total Protein 5.7 (L) 6.5 - 8.1 g/dL   Albumin 2.6 (L) 3.5 - 5.0 g/dL   AST  31 15 - 41 U/L   ALT 31 0 - 44 U/L   Alkaline Phosphatase 63 38 - 126 U/L   Total Bilirubin 0.3 0.3 - 1.2 mg/dL   GFR, Estimated >95 >62 mL/min   Anion gap 9 5 - 15  Glucose, capillary   Collection Time: 04/07/23  7:21 AM  Result Value Ref Range   Glucose-Capillary 107 (H) 70 - 99 mg/dL    Imaging Studies:    [place] MFM UA CORD DOPPLER  Result Date: 04/06/2023 ----------------------------------------------------------------------  OBSTETRICS REPORT                       (Signed Final 04/06/2023 04:48 pm) ---------------------------------------------------------------------- Patient Info  ID #:       [id number]                          D.O.B.:  05/21/1981 (41 yrs)  Name:       Kathryn Glenn                    Visit Date: 04/06/2023 03:33 pm              [name] ---------------------------------------------------------------------- Performed By  Attending:        Noralee Space MD        Ref. Address:     [address]  Performed By:     Percell Boston          Secondary Phy.:   [facility] MAU/Triage                    RDMS  Referred By:      Kathryn Glenn              [facility] ---------------------------------------------------------------------- Orders  #  Description                           Code        Ordered By  1  [place] MFM OB FOLLOW UP                   76816.01    Kathryn Glenn  2  [place] MFM UA CORD DOPPLER                76820.02    Kathryn Glenn ----------------------------------------------------------------------  #  Order #                     Accession #                Episode #  1  [id number]                   [id number]                 [id number]  2  [id number]                   [id number]                 [id number] ---------------------------------------------------------------------- Indications  Maternal care for known or suspected poor      O36.5920  fetal growth, second trimester, not applicable  or  unspecified IUGR  [redacted] weeks gestation of pregnancy                Z3A.24  Hypertension - Chronic/Pre-existing            O10.019  (Labetalol)  Advanced maternal age multigravida 46+,        O19.522  second trimester (41)  Obesity complicating pregnancy, second         O99.212  trimester (BMI 31)  History of cesarean delivery, currently        O34.219  pregnant  Poor obstetric history (prior pre-term deliveryO09.219  at 28 weeks)  LR NIPS, Neg Horizon ---------------------------------------------------------------------- Fetal Evaluation  Num Of Fetuses:         1  Fetal Heart Rate(bpm):  152  Cardiac Activity:       Observed  Presentation:           Breech  Placenta:               Anterior  P. Cord Insertion:      Visualized  Amniotic Fluid  AFI FV:      Within normal limits                              Largest Pocket(cm)                              5.8 ---------------------------------------------------------------------- Biometry  BPD:      48.6  mm     G. Age:  20w 5d        < 1  %    CI:        68.68   %    70 - 86  FL/HC:      14.5   %    18.7 - 20.9  HC:      187.4  mm     G. Age:  21w 0d        < 1  %    HC/AC:      1.33        1.05 - 1.21  AC:      140.6  mm     G. Age:  19w 3d        < 1  %    FL/BPD:     55.8   %    71 - 87  FL:       27.1  mm     G. Age:  18w 2d        < 1  %    FL/AC:      19.3   %    20 - 24  Est. FW:     277  gm    0 lb 10 oz     < 1  % ---------------------------------------------------------------------- OB History  Gravidity:    7         Term:   4  TOP:          1       Ectopic:  1        Living: 4 ---------------------------------------------------------------------- Gestational Age  Clinical EDD:  24w 0d                                        EDD:   07/27/23  U/S Today:     19w 6d                                        EDD:   08/25/23  Best:          24w 0d     Det. By:  Clinical EDD             EDD:   07/27/23  ---------------------------------------------------------------------- Targeted Anatomy  Central Nervous System  Calvarium/Cranial V.:  Appears normal         Cereb./Vermis:          Appears normal  Cavum:                 Appears normal         Cisterna Magna:         Appears normal  Lateral Ventricles:    Appears normal         Midline Falx:           Appears normal  Choroid Plexus:        Appears normal  Spine  Cervical:              Limited                Sacral:                 Limited  Thoracic:              Limited                Shape/Curvature:        Appears normal  Lumbar:  Limited  Head/Neck  Lips:                  Not well visualized    Profile:                Appears normal  Neck:                  Not well visualized    Orbits/Eyes:            Appears normal  Nuchal Fold:           Appears normal         Mandible:               Appears normal  Nasal Bone:            Present                Maxilla:                Appears normal  Thorax  4 Chamber View:        Not well visualized    Interventr. Septum:     Appears normal  Cardiac Rhythm:        Normal                 Cardiac Axis:           Normal  Cardiac Situs:         Appears normal         Diaphragm:              Appears normal  Rt Outflow Tract:      Not well visualized    3 Vessel View:          Not well visualized  Lt Outflow Tract:      Not well visualized    3 V Trachea View:       Appears normal  Aortic Arch:           Appears normal         IVC:                    Appears normal  Ductal Arch:           Not well visualized    Crossing:               Not well visualized  SVC:                   Appears normal  Abdomen  Ventral Wall:          Appears normal         Lt Kidney:              Appears normal  Cord Insertion:        Appears normal         Rt Kidney:              Appears normal  Situs:                 Appears normal         Bladder:                Appears normal  Stomach:               Appears normal  Extremities  Lt  Humerus:            Appears normal  Lt Femur:               Appears normal  Rt Humerus:            Appears normal         Rt Femur:               Appears normal  Lt Forearm:            Appears normal         Lt Lower Leg:           Appears normal  Rt Forearm:            Appears normal         Rt Lower Leg:           Appears normal  Lt Hand:               Open hand nml          Lt Foot:                Nml foot  Rt Hand:               Open hand nml          Rt Foot:                Nml foot  Other  Umbilical Cord:        Normal 3-vessel        Genitalia:              Female-nml ---------------------------------------------------------------------- Doppler - Fetal Vessels  Umbilical Artery                                                              ADFV    RDFV                                                                 No     Yes ---------------------------------------------------------------------- Cervix Uterus Adnexa  Cervix  Not visualized (advanced GA >24wks)  Uterus  No abnormality visualized.  Right Ovary  Not visualized.  Left Ovary  Not visualized.  Cul De Sac  No free fluid seen.  Adnexa  No abnormality visualized ---------------------------------------------------------------------- Impression  Chronic hypertension.  Severe fetal growth restriction.  Gestational diabetes.  Previous 2 cesarean deliveries including a classical cesarean  delivery.  Patient is being evaluated at the MAU for severe-range  hypertension.  On today's ultrasound, the estimated fetal weight (EFW) is  277 grams. EFW and abdominal circumference  measurements are at the 1st percentiles. Head  circumference measurement is at between -2 and -3 SD.  Amniotic fluid is normal and good fetal heart activity is seen.  Umbilical artery Doppler showed persistent reversed-end-  diastolic flow.  Patient has more-likely superimposed preeclampsia. ---------------------------------------------------------------------- Recommendations   -Acute hypertension management.  -Defer steroids since it is likely to make hyperglycemia worse  and there is no fetal benefit now.  -NICU consultation.  -Delivery should be considered if hypertension is not well  controlled and  severe-range hypertension is seen despite  treatment.  -No intervention for fetal survival. ----------------------------------------------------------------------                  Kathryn Space, MD Electronically Signed Final Report   04/06/2023 04:48 pm ----------------------------------------------------------------------   [place] MFM OB FOLLOW UP  Result Date: 04/06/2023 ----------------------------------------------------------------------  OBSTETRICS REPORT                       (Signed Final 04/06/2023 04:48 pm) ---------------------------------------------------------------------- Patient Info  ID #:       [id number]                          D.O.B.:  [birth year] (41 yrs)  Name:       Kathryn Glenn                    Visit Date: 04/06/2023 03:33 pm              [name] ---------------------------------------------------------------------- Performed By  Attending:        Noralee Space MD        Ref. Address:     [address]  Performed By:     Percell Boston          Secondary Phy.:   [facility] MAU/Triage                    RDMS  Referred By:      Kathryn Glenn              [facility] ---------------------------------------------------------------------- Orders  #  Description                           Code        Ordered By  1  [place] MFM OB FOLLOW UP                   76816.01    Kathryn Glenn  2  [place] MFM UA CORD DOPPLER                [id number]    Kathryn Glenn ----------------------------------------------------------------------  #  Order #                     Accession #                Episode #  1  [id number]                   [id number]                 [id number]  2  [id number]                    [id number]                 [id number] ---------------------------------------------------------------------- Indications  Maternal care for known or suspected poor      O36.5920  fetal growth, second trimester, not applicable  or unspecified IUGR  [redacted] weeks gestation of  pregnancy                Z3A.24  Hypertension - Chronic/Pre-existing            O10.019  (Labetalol)  Advanced maternal age multigravida 9+,        O14.522  second trimester (41)  Obesity complicating pregnancy, second         O99.212  trimester (BMI 31)  History of cesarean delivery, currently        O34.219  pregnant  Poor obstetric history (prior pre-term deliveryO09.219  at 28 weeks)  LR NIPS, Neg Horizon ---------------------------------------------------------------------- Fetal Evaluation  Num Of Fetuses:         1  Fetal Heart Rate(bpm):  152  Cardiac Activity:       Observed  Presentation:           Breech  Placenta:               Anterior  P. Cord Insertion:      Visualized  Amniotic Fluid  AFI FV:      Within normal limits                              Largest Pocket(cm)                              5.8 ---------------------------------------------------------------------- Biometry  BPD:      48.6  mm     G. Age:  20w 5d        < 1  %    CI:        68.68   %    70 - 86                                                          FL/HC:      14.5   %    18.7 - 20.9  HC:      187.4  mm     G. Age:  21w 0d        < 1  %    HC/AC:      1.33        1.05 - 1.21  AC:      140.6  mm     G. Age:  19w 3d        < 1  %    FL/BPD:     55.8   %    71 - 87  FL:       27.1  mm     G. Age:  18w 2d        < 1  %    FL/AC:      19.3   %    20 - 24  Est. FW:     277  gm    0 lb 10 oz     < 1  % ---------------------------------------------------------------------- OB History  Gravidity:    7         Term:   4  TOP:          1       Ectopic:  1        Living: 4 ----------------------------------------------------------------------  Gestational Age  Clinical EDD:   24w 0d                                        EDD:   07/27/23  U/S Today:     19w 6d                                        EDD:   08/25/23  Best:          24w 0d     Det. By:  Clinical EDD             EDD:   07/27/23 ---------------------------------------------------------------------- Targeted Anatomy  Central Nervous System  Calvarium/Cranial V.:  Appears normal         Cereb./Vermis:          Appears normal  Cavum:                 Appears normal         Cisterna Magna:         Appears normal  Lateral Ventricles:    Appears normal         Midline Falx:           Appears normal  Choroid Plexus:        Appears normal  Spine  Cervical:              Limited                Sacral:                 Limited  Thoracic:              Limited                Shape/Curvature:        Appears normal  Lumbar:                Limited  Head/Neck  Lips:                  Not well visualized    Profile:                Appears normal  Neck:                  Not well visualized    Orbits/Eyes:            Appears normal  Nuchal Fold:           Appears normal         Mandible:               Appears normal  Nasal Bone:            Present                Maxilla:                Appears normal  Thorax  4 Chamber View:        Not well visualized    Interventr. Septum:     Appears normal  Cardiac Rhythm:        Normal                 Cardiac Axis:  Normal  Cardiac Situs:         Appears normal         Diaphragm:              Appears normal  Rt Outflow Tract:      Not well visualized    3 Vessel View:          Not well visualized  Lt Outflow Tract:      Not well visualized    3 V Trachea View:       Appears normal  Aortic Arch:           Appears normal         IVC:                    Appears normal  Ductal Arch:           Not well visualized    Crossing:               Not well visualized  SVC:                   Appears normal  Abdomen  Ventral Wall:          Appears normal         Lt Kidney:              Appears normal  Cord Insertion:         Appears normal         Rt Kidney:              Appears normal  Situs:                 Appears normal         Bladder:                Appears normal  Stomach:               Appears normal  Extremities  Lt Humerus:            Appears normal         Lt Femur:               Appears normal  Rt Humerus:            Appears normal         Rt Femur:               Appears normal  Lt Forearm:            Appears normal         Lt Lower Leg:           Appears normal  Rt Forearm:            Appears normal         Rt Lower Leg:           Appears normal  Lt Hand:               Open hand nml          Lt Foot:                Nml foot  Rt Hand:               Open hand nml          Rt Foot:  Nml foot  Other  Umbilical Cord:        Normal 3-vessel        Genitalia:              Female-nml ---------------------------------------------------------------------- Doppler - Fetal Vessels  Umbilical Artery                                                              ADFV    RDFV                                                                 No     Yes ---------------------------------------------------------------------- Cervix Uterus Adnexa  Cervix  Not visualized (advanced GA >24wks)  Uterus  No abnormality visualized.  Right Ovary  Not visualized.  Left Ovary  Not visualized.  Cul De Sac  No free fluid seen.  Adnexa  No abnormality visualized ---------------------------------------------------------------------- Impression  Chronic hypertension.  Severe fetal growth restriction.  Gestational diabetes.  Previous 2 cesarean deliveries including a classical cesarean  delivery.  Patient is being evaluated at the MAU for severe-range  hypertension.  On today's ultrasound, the estimated fetal weight (EFW) is  277 grams. EFW and abdominal circumference  measurements are at the 1st percentiles. Head  circumference measurement is at between -2 and -3 SD.  Amniotic fluid is normal and good fetal heart activity is seen.  Umbilical  artery Doppler showed persistent reversed-end-  diastolic flow.  Patient has more-likely superimposed preeclampsia. ---------------------------------------------------------------------- Recommendations  -Acute hypertension management.  -Defer steroids since it is likely to make hyperglycemia worse  and there is no fetal benefit now.  -NICU consultation.  -Delivery should be considered if hypertension is not well  controlled and severe-range hypertension is seen despite  treatment.  -No intervention for fetal survival. ----------------------------------------------------------------------                  Kathryn Space, MD Electronically Signed Final Report   04/06/2023 04:48 pm ----------------------------------------------------------------------     Medications:  Scheduled  docusate sodium  100 mg Oral Daily   labetalol  600 mg Oral Q8H   prenatal multivitamin  1 tablet Oral Q1200   I have reviewed the patient's current medications.  ASSESSMENT: [id number] [redacted]w[redacted]d Estimated Date of Delivery: 07/27/23  Patient Active Problem List   Diagnosis Date Noted   Advanced maternal age in multigravida 04/06/2023   Fetal growth restriction antepartum 04/06/2023   Pregnancy complicated by obesity 03/25/2023   IUGR (intrauterine growth restriction) affecting care of mother 03/13/2023   Language barrier 03/10/2023   Chronic hypertension during pregnancy, antepartum 02/17/2023   GDM (gestational diabetes mellitus) 02/16/2023   GBS bacteriuria 02/16/2023   Prediabetes 02/11/2023   Supervision of high risk pregnancy, antepartum 02/10/2023   History of postpartum depression 09/06/2012   Previous classical cesarean delivery, antepartum condition or complication 04/22/2012   History of preterm delivery, currently pregnant 04/05/2012    PLAN: 1) CHTN with superimposed preeclampsia - No severe range BP overnight - Continue labetalol 600 TID - Continue magnesium sulfate for seizure prophylaxis - Labs remain  stable - will check magnesium level  2) Severe FGR - Patient and husband aware of poor prognosis - Both are aware of plan for delivery for maternal indications at this time - Answered all questions - They desire NICU resuscitation at time of delivery - They are aware of high risk of IUFD  3)GDM - currently diet controlled - Will continue monitoring CBG and manage accordingly  Continue routine care  Kathryn Glenn 04/07/2023,8:03 AM

## 2023-04-08 ENCOUNTER — Inpatient Hospital Stay (HOSPITAL_COMMUNITY): Payer: Medicaid Other | Admitting: Anesthesiology

## 2023-04-08 ENCOUNTER — Other Ambulatory Visit: Payer: Self-pay

## 2023-04-08 ENCOUNTER — Encounter (HOSPITAL_COMMUNITY): Payer: Self-pay | Admitting: Obstetrics & Gynecology

## 2023-04-08 DIAGNOSIS — O24419 Gestational diabetes mellitus in pregnancy, unspecified control: Secondary | ICD-10-CM | POA: Diagnosis present

## 2023-04-08 DIAGNOSIS — Z8632 Personal history of gestational diabetes: Secondary | ICD-10-CM | POA: Diagnosis present

## 2023-04-08 LAB — CBC
HCT: 35.2 % — ABNORMAL LOW (ref 36.0–46.0)
Hemoglobin: 11.9 g/dL — ABNORMAL LOW (ref 12.0–15.0)
MCH: 30.4 pg (ref 26.0–34.0)
MCHC: 33.8 g/dL (ref 30.0–36.0)
MCV: 89.8 fL (ref 80.0–100.0)
Platelets: 170 10*3/uL (ref 150–400)
RBC: 3.92 MIL/uL (ref 3.87–5.11)
RDW: 15.5 % (ref 11.5–15.5)
WBC: 8 10*3/uL (ref 4.0–10.5)
nRBC: 0 % (ref 0.0–0.2)

## 2023-04-08 LAB — GLUCOSE, CAPILLARY
Glucose-Capillary: 131 mg/dL — ABNORMAL HIGH (ref 70–99)
Glucose-Capillary: 107 mg/dL — ABNORMAL HIGH (ref 70–99)
Glucose-Capillary: 91 mg/dL (ref 70–99)
Glucose-Capillary: 98 mg/dL (ref 70–99)

## 2023-04-08 LAB — TYPE AND SCREEN
ABO/RH(D): O POS
Antibody Screen: NEGATIVE

## 2023-04-08 MED ORDER — LACTATED RINGERS IV SOLN: INTRAVENOUS | Status: DC

## 2023-04-08 MED ORDER — PHENYLEPHRINE 80 MCG/ML (10ML) SYRINGE FOR IV PUSH (FOR BLOOD PRESSURE SUPPORT): 80.0000 ug | PREFILLED_SYRINGE | INTRAVENOUS | Status: DC | PRN

## 2023-04-08 MED ORDER — OXYCODONE-ACETAMINOPHEN 5-325 MG PO TABS: 2.0000 | ORAL_TABLET | ORAL | Status: DC | PRN

## 2023-04-08 MED ORDER — OXYTOCIN-SODIUM CHLORIDE 30-0.9 UT/500ML-% IV SOLN
2.5000 [IU]/h | INTRAVENOUS | Status: DC
  Administered 2023-04-09: 2.5 [IU]/h via INTRAVENOUS
  Filled 2023-04-08: qty 500

## 2023-04-08 MED ORDER — LIDOCAINE HCL (PF) 1 % IJ SOLN: 30.0000 mL | INTRAMUSCULAR | Status: DC | PRN

## 2023-04-08 MED ORDER — LIDOCAINE HCL (PF) 1 % IJ SOLN
INTRAMUSCULAR | Status: DC | PRN
  Administered 2023-04-08: 10 mL via EPIDURAL

## 2023-04-08 MED ORDER — OXYCODONE-ACETAMINOPHEN 5-325 MG PO TABS: 1.0000 | ORAL_TABLET | ORAL | Status: DC | PRN

## 2023-04-08 MED ORDER — FLEET ENEMA RE ENEM: 1.0000 | ENEMA | RECTAL | Status: DC | PRN

## 2023-04-08 MED ORDER — ONDANSETRON HCL 4 MG/2ML IJ SOLN: 4.0000 mg | Freq: Four times a day (QID) | INTRAMUSCULAR | Status: DC | PRN

## 2023-04-08 MED ORDER — MISOPROSTOL 200 MCG PO TABS
400.0000 ug | ORAL_TABLET | ORAL | Status: DC
  Administered 2023-04-08 – 2023-04-09 (×2): 400 ug via ORAL
  Filled 2023-04-08 (×2): qty 2

## 2023-04-08 MED ORDER — SOD CITRATE-CITRIC ACID 500-334 MG/5ML PO SOLN: 30.0000 mL | ORAL | Status: DC | PRN

## 2023-04-08 MED ORDER — DIPHENHYDRAMINE HCL 50 MG/ML IJ SOLN: 12.5000 mg | INTRAMUSCULAR | Status: DC | PRN

## 2023-04-08 MED ORDER — EPHEDRINE 5 MG/ML INJ: 10.0000 mg | INTRAVENOUS | Status: DC | PRN

## 2023-04-08 MED ORDER — MAGNESIUM SULFATE 40 GM/1000ML IV SOLN
2.0000 g/h | INTRAVENOUS | Status: DC
  Administered 2023-04-09: 2 g/h via INTRAVENOUS
  Filled 2023-04-08 (×2): qty 1000

## 2023-04-08 MED ORDER — METFORMIN HCL ER 500 MG PO TB24
500.0000 mg | ORAL_TABLET | Freq: Every day | ORAL | Status: DC
  Filled 2023-04-08: qty 1

## 2023-04-08 MED ORDER — FENTANYL CITRATE (PF) 100 MCG/2ML IJ SOLN: 50.0000 ug | INTRAMUSCULAR | Status: DC | PRN

## 2023-04-08 MED ORDER — ACETAMINOPHEN 325 MG PO TABS: 650.0000 mg | ORAL_TABLET | ORAL | Status: DC | PRN

## 2023-04-08 MED ORDER — LACTATED RINGERS IV SOLN: 500.0000 mL | INTRAVENOUS | Status: DC | PRN

## 2023-04-08 MED ORDER — OXYTOCIN BOLUS FROM INFUSION
333.0000 mL | Freq: Once | INTRAVENOUS | Status: AC
  Administered 2023-04-09: 333 mL via INTRAVENOUS

## 2023-04-08 MED ORDER — MAGNESIUM SULFATE BOLUS VIA INFUSION
4.0000 g | Freq: Once | INTRAVENOUS | Status: AC
  Administered 2023-04-08: 4 g via INTRAVENOUS
  Filled 2023-04-08: qty 1000

## 2023-04-08 MED ORDER — FENTANYL-BUPIVACAINE-NACL 0.5-0.125-0.9 MG/250ML-% EP SOLN
12.0000 mL/h | EPIDURAL | Status: DC | PRN
  Administered 2023-04-08: 12 mL/h via EPIDURAL
  Filled 2023-04-08: qty 250

## 2023-04-08 NOTE — Progress Notes (Signed)
Patient ID: Kathryn Glenn, female   DOB: 30-May-1981, 42 y.o.   MRN: [id number] FACULTY PRACTICE ANTEPARTUM(COMPREHENSIVE) NOTE  Kathryn Glenn is a 42 y.o. [id number] at [redacted]w[redacted]d who is admitted for severe pre-eclampsia .    Fetal presentation is breech. Length of Stay:  2  Days  Date of admission:04/06/2023  Subjective: Patient reports feeling well this morning coming out of the shower without any complaints Patient reports the fetal movement as active. Patient reports uterine contraction  activity as none. Patient reports  vaginal bleeding as none. Patient describes fluid per vagina as None.  Vitals:  Blood pressure 133/69, pulse 66, temperature 98.1 F (36.7 C), temperature source Oral, resp. rate 16, weight 73.9 kg, SpO2 97%, unknown if currently breastfeeding. Vitals:   04/07/23 1705 04/07/23 1935 04/07/23 2330 04/08/23 0438  BP:  (!) 141/94 131/79 133/69  Pulse:  67 67 66  Resp: 16 17 16 16   Temp:  98.1 F (36.7 C) 98 F (36.7 C) 98.1 F (36.7 C)  TempSrc:  Oral Oral Oral  SpO2:  97% 98% 97%  Weight:       Physical Examination: GENERAL: Well-developed, well-nourished female in no acute distress.  LUNGS: Clear to auscultation bilaterally.  HEART: Regular rate and rhythm. ABDOMEN: Soft, nontender, nondistended. No organomegaly. PELVIC: Not indicated EXTREMITIES: No cyanosis, clubbing, or edema, 2+ distal pulses.   Fetal Monitoring: unable to obtain dopplers last night Labs:  Results for orders placed or performed during the hospital encounter of 04/06/23 (from the past 24 hour(s))  Glucose, capillary   Collection Time: 04/07/23 10:29 AM  Result Value Ref Range   Glucose-Capillary 181 (H) 70 - 99 mg/dL  Glucose, capillary   Collection Time: 04/07/23  2:23 PM  Result Value Ref Range   Glucose-Capillary 127 (H) 70 - 99 mg/dL  Glucose, capillary   Collection Time: 04/07/23 10:06 PM  Result Value Ref Range   Glucose-Capillary 123 (H) 70 - 99 mg/dL   Glucose, capillary   Collection Time: 04/08/23  6:21 AM  Result Value Ref Range   Glucose-Capillary 131 (H) 70 - 99 mg/dL    Imaging Studies:    [place] MFM UA CORD DOPPLER  Result Date: 04/06/2023 ----------------------------------------------------------------------  OBSTETRICS REPORT                       (Signed Final 04/06/2023 04:48 pm) ---------------------------------------------------------------------- Patient Info  ID #:       [id number]                          D.O.B.:  1980/12/31 (42 yrs)  Name:       Kathryn Glenn                    Visit Date: 04/06/2023 03:33 pm              Kathryn Glenn ---------------------------------------------------------------------- Performed By  Attending:        Noralee Space MD        Ref. Address:     [address]  Performed By:     Percell Boston          Secondary Phy.:   [facility] MAU/Triage                    RDMS  Referred By:      Mary Sella              [facility]                               [facility] ---------------------------------------------------------------------- Orders  #  Description                           Code        Ordered By  1  [place] MFM OB FOLLOW UP                   76816.01    ELIZABETH DAVIS  2  [place] MFM UA CORD DOPPLER                [id number]    ELIZABETH DAVIS ----------------------------------------------------------------------  #  Order #                     Accession #                Episode #  1  [id number]                   [id number]                 [id number]  2  [id number]                   [id number]                 [id number] ---------------------------------------------------------------------- Indications  Maternal care for known or suspected poor      O36.5920  fetal growth, second trimester, not applicable  or unspecified IUGR  [redacted] weeks gestation of pregnancy                Z3A.24  Hypertension - Chronic/Pre-existing            O10.019  (Labetalol)  Advanced maternal age  multigravida 22+,        O62.522  second trimester (41)  Obesity complicating pregnancy, second         O99.212  trimester (BMI 31)  History of cesarean delivery, currently        O34.219  pregnant  Poor obstetric history (prior pre-term deliveryO09.219  at 28 weeks)  LR NIPS, Neg Horizon ---------------------------------------------------------------------- Fetal Evaluation  Num Of Fetuses:         1  Fetal Heart Rate(bpm):  152  Cardiac Activity:       Observed  Presentation:           Breech  Placenta:               Anterior  P. Cord Insertion:      Visualized  Amniotic Fluid  AFI FV:      Within normal limits                              Largest Pocket(cm)                              5.8 ---------------------------------------------------------------------- Biometry  BPD:      48.6  mm     G. Age:  20w 5d        < 1  %  CI:        68.68   %    70 - 86                                                          FL/HC:      14.5   %    18.7 - 20.9  HC:      187.4  mm     G. Age:  21w 0d        < 1  %    HC/AC:      1.33        1.05 - 1.21  AC:      140.6  mm     G. Age:  19w 3d        < 1  %    FL/BPD:     55.8   %    71 - 87  FL:       27.1  mm     G. Age:  18w 2d        < 1  %    FL/AC:      19.3   %    20 - 24  Est. FW:     277  gm    0 lb 10 oz     < 1  % ---------------------------------------------------------------------- OB History  Gravidity:    7         Term:   4  TOP:          1       Ectopic:  1        Living: 4 ---------------------------------------------------------------------- Gestational Age  Clinical EDD:  24w 0d                                        EDD:   07/27/23  U/S Today:     19w 6d                                        EDD:   08/25/23  Best:          24w 0d     Det. By:  Clinical EDD             EDD:   07/27/23 ---------------------------------------------------------------------- Targeted Anatomy  Central Nervous System  Calvarium/Cranial V.:  Appears normal         Cereb./Vermis:           Appears normal  Cavum:                 Appears normal         Cisterna Magna:         Appears normal  Lateral Ventricles:    Appears normal         Midline Falx:           Appears normal  Choroid Plexus:        Appears normal  Spine  Cervical:              Limited  Sacral:                 Limited  Thoracic:              Limited                Shape/Curvature:        Appears normal  Lumbar:                Limited  Head/Neck  Lips:                  Not well visualized    Profile:                Appears normal  Neck:                  Not well visualized    Orbits/Eyes:            Appears normal  Nuchal Fold:           Appears normal         Mandible:               Appears normal  Nasal Bone:            Present                Maxilla:                Appears normal  Thorax  4 Chamber View:        Not well visualized    Interventr. Septum:     Appears normal  Cardiac Rhythm:        Normal                 Cardiac Axis:           Normal  Cardiac Situs:         Appears normal         Diaphragm:              Appears normal  Rt Outflow Tract:      Not well visualized    3 Vessel View:          Not well visualized  Lt Outflow Tract:      Not well visualized    3 V Trachea View:       Appears normal  Aortic Arch:           Appears normal         IVC:                    Appears normal  Ductal Arch:           Not well visualized    Crossing:               Not well visualized  SVC:                   Appears normal  Abdomen  Ventral Wall:          Appears normal         Lt Kidney:              Appears normal  Cord Insertion:        Appears normal         Rt Kidney:              Appears normal  Situs:  Appears normal         Bladder:                Appears normal  Stomach:               Appears normal  Extremities  Lt Humerus:            Appears normal         Lt Femur:               Appears normal  Rt Humerus:            Appears normal         Rt Femur:               Appears normal  Lt Forearm:             Appears normal         Lt Lower Leg:           Appears normal  Rt Forearm:            Appears normal         Rt Lower Leg:           Appears normal  Lt Hand:               Open hand nml          Lt Foot:                Nml foot  Rt Hand:               Open hand nml          Rt Foot:                Nml foot  Other  Umbilical Cord:        Normal 3-vessel        Genitalia:              Female-nml ---------------------------------------------------------------------- Doppler - Fetal Vessels  Umbilical Artery                                                              ADFV    RDFV                                                                 No     Yes ---------------------------------------------------------------------- Cervix Uterus Adnexa  Cervix  Not visualized (advanced GA >24wks)  Uterus  No abnormality visualized.  Right Ovary  Not visualized.  Left Ovary  Not visualized.  Cul De Sac  No free fluid seen.  Adnexa  No abnormality visualized ---------------------------------------------------------------------- Impression  Chronic hypertension.  Severe fetal growth restriction.  Gestational diabetes.  Previous 2 cesarean deliveries including a classical cesarean  delivery.  Patient is being evaluated at the MAU for severe-range  hypertension.  On today's ultrasound, the estimated fetal weight (EFW) is  277 grams. EFW and abdominal circumference  measurements are at the 1st percentiles. Head  circumference measurement is at between -2  and -3 SD.  Amniotic fluid is normal and good fetal heart activity is seen.  Umbilical artery Doppler showed persistent reversed-end-  diastolic flow.  Patient has more-likely superimposed preeclampsia. ---------------------------------------------------------------------- Recommendations  -Acute hypertension management.  -Defer steroids since it is likely to make hyperglycemia worse  and there is no fetal benefit now.  -NICU consultation.  -Delivery should be considered if  hypertension is not well  controlled and severe-range hypertension is seen despite  treatment.  -No intervention for fetal survival. ----------------------------------------------------------------------                  Noralee Space, MD Electronically Signed Final Report   04/06/2023 04:48 pm ----------------------------------------------------------------------   [place] MFM OB FOLLOW UP  Result Date: 04/06/2023 ----------------------------------------------------------------------  OBSTETRICS REPORT                       (Signed Final 04/06/2023 04:48 pm) ---------------------------------------------------------------------- Patient Info  ID #:       [id number]                          D.O.B.:  1981/05/17 (41 yrs)  Name:       Kathryn Glenn                    Visit Date: 04/06/2023 03:33 pm              Kathryn Glenn ---------------------------------------------------------------------- Performed By  Attending:        Noralee Space MD        Ref. Address:     [address]  Performed By:     Percell Boston          Secondary Phy.:   [facility] MAU/Triage                    RDMS  Referred By:      Mary Sella              [facility] ---------------------------------------------------------------------- Orders  #  Description                           Code        Ordered By  1  [place] MFM OB FOLLOW UP                   76816.01    ELIZABETH DAVIS  2  [place] MFM UA CORD DOPPLER                [id number]    ELIZABETH DAVIS ----------------------------------------------------------------------  #  Order #                     Accession #                Episode #  1  [id number]                   [id number]                 [id number]  2  [id number]                   [id number]                 [id number] ---------------------------------------------------------------------- Indications  Maternal care for known or suspected poor      O36.5920  fetal growth,  second trimester, not applicable  or unspecified IUGR  [redacted] weeks gestation of pregnancy                Z3A.24  Hypertension - Chronic/Pre-existing            O10.019  (Labetalol)  Advanced maternal age multigravida 31+,        O72.522  second trimester (41)  Obesity complicating pregnancy, second         O99.212  trimester (BMI 31)  History of cesarean delivery, currently        O34.219  pregnant  Poor obstetric history (prior pre-term deliveryO09.219  at 28 weeks)  LR NIPS, Neg Horizon ---------------------------------------------------------------------- Fetal Evaluation  Num Of Fetuses:         1  Fetal Heart Rate(bpm):  152  Cardiac Activity:       Observed  Presentation:           Breech  Placenta:               Anterior  P. Cord Insertion:      Visualized  Amniotic Fluid  AFI FV:      Within normal limits                              Largest Pocket(cm)                              5.8 ---------------------------------------------------------------------- Biometry  BPD:      48.6  mm     G. Age:  20w 5d        < 1  %    CI:        68.68   %    70 - 86                                                          FL/HC:      14.5   %    18.7 - 20.9  HC:      187.4  mm     G. Age:  21w 0d        < 1  %    HC/AC:      1.33        1.05 - 1.21  AC:      140.6  mm     G. Age:  19w 3d        < 1  %    FL/BPD:     55.8   %    71 - 87  FL:       27.1  mm     G. Age:  18w 2d        < 1  %    FL/AC:      19.3   %    20 - 24  Est. FW:  277  gm    0 lb 10 oz     < 1  % ---------------------------------------------------------------------- OB History  Gravidity:    7         Term:   4  TOP:          1       Ectopic:  1        Living: 4 ---------------------------------------------------------------------- Gestational Age  Clinical EDD:  24w 0d                                        EDD:   07/27/23  U/S Today:     19w 6d                                        EDD:   08/25/23  Best:          24w 0d     Det. By:  Clinical EDD              EDD:   07/27/23 ---------------------------------------------------------------------- Targeted Anatomy  Central Nervous System  Calvarium/Cranial V.:  Appears normal         Cereb./Vermis:          Appears normal  Cavum:                 Appears normal         Cisterna Magna:         Appears normal  Lateral Ventricles:    Appears normal         Midline Falx:           Appears normal  Choroid Plexus:        Appears normal  Spine  Cervical:              Limited                Sacral:                 Limited  Thoracic:              Limited                Shape/Curvature:        Appears normal  Lumbar:                Limited  Head/Neck  Lips:                  Not well visualized    Profile:                Appears normal  Neck:                  Not well visualized    Orbits/Eyes:            Appears normal  Nuchal Fold:           Appears normal         Mandible:               Appears normal  Nasal Bone:            Present                Maxilla:  Appears normal  Thorax  4 Chamber View:        Not well visualized    Interventr. Septum:     Appears normal  Cardiac Rhythm:        Normal                 Cardiac Axis:           Normal  Cardiac Situs:         Appears normal         Diaphragm:              Appears normal  Rt Outflow Tract:      Not well visualized    3 Vessel View:          Not well visualized  Lt Outflow Tract:      Not well visualized    3 V Trachea View:       Appears normal  Aortic Arch:           Appears normal         IVC:                    Appears normal  Ductal Arch:           Not well visualized    Crossing:               Not well visualized  SVC:                   Appears normal  Abdomen  Ventral Wall:          Appears normal         Lt Kidney:              Appears normal  Cord Insertion:        Appears normal         Rt Kidney:              Appears normal  Situs:                 Appears normal         Bladder:                Appears normal  Stomach:               Appears normal   Extremities  Lt Humerus:            Appears normal         Lt Femur:               Appears normal  Rt Humerus:            Appears normal         Rt Femur:               Appears normal  Lt Forearm:            Appears normal         Lt Lower Leg:           Appears normal  Rt Forearm:            Appears normal         Rt Lower Leg:           Appears normal  Lt Hand:               Open hand nml  Lt Foot:                Nml foot  Rt Hand:               Open hand nml          Rt Foot:                Nml foot  Other  Umbilical Cord:        Normal 3-vessel        Genitalia:              Female-nml ---------------------------------------------------------------------- Doppler - Fetal Vessels  Umbilical Artery                                                              ADFV    RDFV                                                                 No     Yes ---------------------------------------------------------------------- Cervix Uterus Adnexa  Cervix  Not visualized (advanced GA >24wks)  Uterus  No abnormality visualized.  Right Ovary  Not visualized.  Left Ovary  Not visualized.  Cul De Sac  No free fluid seen.  Adnexa  No abnormality visualized ---------------------------------------------------------------------- Impression  Chronic hypertension.  Severe fetal growth restriction.  Gestational diabetes.  Previous 2 cesarean deliveries including a classical cesarean  delivery.  Patient is being evaluated at the MAU for severe-range  hypertension.  On today's ultrasound, the estimated fetal weight (EFW) is  277 grams. EFW and abdominal circumference  measurements are at the 1st percentiles. Head  circumference measurement is at between -2 and -3 SD.  Amniotic fluid is normal and good fetal heart activity is seen.  Umbilical artery Doppler showed persistent reversed-end-  diastolic flow.  Patient has more-likely superimposed preeclampsia. ----------------------------------------------------------------------  Recommendations  -Acute hypertension management.  -Defer steroids since it is likely to make hyperglycemia worse  and there is no fetal benefit now.  -NICU consultation.  -Delivery should be considered if hypertension is not well  controlled and severe-range hypertension is seen despite  treatment.  -No intervention for fetal survival. ----------------------------------------------------------------------                  Noralee Space, MD Electronically Signed Final Report   04/06/2023 04:48 pm ----------------------------------------------------------------------     Medications:  Scheduled  docusate sodium  100 mg Oral Daily   labetalol  400 mg Oral Q8H   prenatal multivitamin  1 tablet Oral Q1200   I have reviewed the patient's current medications.  ASSESSMENT: Patient Active Problem List   Diagnosis Date Noted   [redacted] weeks gestation of pregnancy 04/07/2023   Advanced maternal age in multigravida 04/06/2023   Fetal growth restriction antepartum 04/06/2023   Pregnancy complicated by obesity 03/25/2023   IUGR (intrauterine growth restriction) affecting care of mother 03/13/2023   Language barrier 03/10/2023   Chronic hypertension during pregnancy, antepartum 02/17/2023   GDM (gestational diabetes mellitus) 02/16/2023   GBS bacteriuria 02/16/2023   Prediabetes 02/11/2023  Supervision of high risk pregnancy, antepartum 02/10/2023   History of postpartum depression 09/06/2012   Previous classical cesarean delivery, antepartum condition or complication 04/22/2012   History of preterm delivery, currently pregnant 04/05/2012    PLAN: 1) CHTN with superimposed preeclampsia - No severe range BP overnight - Continue labetalol 400 TID - Completed magnesium sulfate for seizure prophylaxis   2) Severe FGR - Patient and husband aware of poor prognosis - Unable to obtain fetal heart rate with doppler and bedside ultrasound. Preliminary report from limited ultrasound is read as inability to  obtain heart tones as well. Patient informed of this preliminary report this morning along with diagnosis of IUFD. Patient is adamant that she felt fetal movement overnight and this morning while in the shower. - Will follow up on ultrasound report this am with plan for outpatient follow up as previously discussed if viability is concerned. If IUFD diagnosed, discussed need for planning for vaginal delivery. Patient verbalized understanding and all questions were answered  3) GDM - currently diet controlled - Elevated fasting CBG. Will start CBG at bedtime  Continue routine care Spanish interpreter present during the encounter  Kathryn Glenn 04/08/2023,7:27 AM

## 2023-04-08 NOTE — Progress Notes (Signed)
Spanish interpreter present.  BSUS performed to confirm with mom fetal demise.  Offered my condolences and discussed plan for IOL.  Questions/concerns were addressed.  Plan to transfer to L&D, desires epidural for pain management.  Cytotec 400mg  q 4hr  Myna Hidalgo, DO Attending Obstetrician & Gynecologist, [facility] for [employer], [facility]

## 2023-04-08 NOTE — Anesthesia Preprocedure Evaluation (Signed)
Anesthesia Evaluation  Patient identified by MRN, date of birth, ID band Patient awake    Reviewed: Allergy & Precautions, H&P , NPO status , Patient's Chart, lab work & pertinent test results  Airway Mallampati: II       Dental no notable dental hx.    Pulmonary neg pulmonary ROS   Pulmonary exam normal breath sounds clear to auscultation       Cardiovascular hypertension, Pt. on home beta blockers Normal cardiovascular exam     Neuro/Psych negative neurological ROS  negative psych ROS   GI/Hepatic negative GI ROS, Neg liver ROS,,,  Endo/Other  negative endocrine ROSdiabetes, Gestational    Renal/GU negative Renal ROS     Musculoskeletal   Abdominal  (+) + obese  Peds  Hematology  (+) Blood dyscrasia, anemia   Anesthesia Other Findings   Reproductive/Obstetrics (+) Pregnancy                             Anesthesia Physical Anesthesia Plan  ASA: 2  Anesthesia Plan: Epidural   Post-op Pain Management:    Induction:   PONV Risk Score and Plan:   Airway Management Planned:   Additional Equipment:   Intra-op Plan:   Post-operative Plan:   Informed Consent: I have reviewed the patients History and Physical, chart, labs and discussed the procedure including the risks, benefits and alternatives for the proposed anesthesia with the patient or authorized representative who has indicated his/her understanding and acceptance.       Plan Discussed with:   Anesthesia Plan Comments:        Anesthesia Quick Evaluation

## 2023-04-09 ENCOUNTER — Encounter (HOSPITAL_COMMUNITY): Payer: Self-pay | Admitting: Obstetrics & Gynecology

## 2023-04-09 DIAGNOSIS — O365930 Maternal care for other known or suspected poor fetal growth, third trimester, not applicable or unspecified: Secondary | ICD-10-CM

## 2023-04-09 DIAGNOSIS — O24420 Gestational diabetes mellitus in childbirth, diet controlled: Secondary | ICD-10-CM

## 2023-04-09 DIAGNOSIS — O364XX0 Maternal care for intrauterine death, not applicable or unspecified: Secondary | ICD-10-CM | POA: Insufficient documentation

## 2023-04-09 DIAGNOSIS — O1414 Severe pre-eclampsia complicating childbirth: Secondary | ICD-10-CM

## 2023-04-09 DIAGNOSIS — Z3A24 24 weeks gestation of pregnancy: Secondary | ICD-10-CM

## 2023-04-09 DIAGNOSIS — O34211 Maternal care for low transverse scar from previous cesarean delivery: Secondary | ICD-10-CM

## 2023-04-09 DIAGNOSIS — O99820 Streptococcus B carrier state complicating pregnancy: Secondary | ICD-10-CM

## 2023-04-09 DIAGNOSIS — Z8759 Personal history of other complications of pregnancy, childbirth and the puerperium: Secondary | ICD-10-CM | POA: Insufficient documentation

## 2023-04-09 LAB — CBC
HCT: 36.8 % (ref 36.0–46.0)
HCT: 35.7 % — ABNORMAL LOW (ref 36.0–46.0)
Hemoglobin: 12.4 g/dL (ref 12.0–15.0)
Hemoglobin: 12 g/dL (ref 12.0–15.0)
MCH: 31 pg (ref 26.0–34.0)
MCH: 29.9 pg (ref 26.0–34.0)
MCHC: 33.7 g/dL (ref 30.0–36.0)
MCHC: 33.6 g/dL (ref 30.0–36.0)
MCV: 92 fL (ref 80.0–100.0)
MCV: 88.8 fL (ref 80.0–100.0)
Platelets: 162 10*3/uL (ref 150–400)
Platelets: 163 10*3/uL (ref 150–400)
RBC: 4 MIL/uL (ref 3.87–5.11)
RBC: 4.02 MIL/uL (ref 3.87–5.11)
RDW: 15.4 % (ref 11.5–15.5)
RDW: 15.4 % (ref 11.5–15.5)
WBC: 10.1 10*3/uL (ref 4.0–10.5)
WBC: 10.3 10*3/uL (ref 4.0–10.5)
nRBC: 0 % (ref 0.0–0.2)
nRBC: 0 % (ref 0.0–0.2)

## 2023-04-09 LAB — RPR: RPR Ser Ql: NONREACTIVE

## 2023-04-09 MED ORDER — TETANUS-DIPHTH-ACELL PERTUSSIS 5-2.5-18.5 LF-MCG/0.5 IM SUSY: 0.5000 mL | PREFILLED_SYRINGE | Freq: Once | INTRAMUSCULAR | Status: DC

## 2023-04-09 MED ORDER — ONDANSETRON HCL 4 MG/2ML IJ SOLN: 4.0000 mg | INTRAMUSCULAR | Status: DC | PRN

## 2023-04-09 MED ORDER — IBUPROFEN 600 MG PO TABS
600.0000 mg | ORAL_TABLET | Freq: Four times a day (QID) | ORAL | Status: DC
  Administered 2023-04-09 – 2023-04-10 (×3): 600 mg via ORAL
  Filled 2023-04-09 (×5): qty 1

## 2023-04-09 MED ORDER — WITCH HAZEL-GLYCERIN EX PADS: 1.0000 | MEDICATED_PAD | CUTANEOUS | Status: DC | PRN

## 2023-04-09 MED ORDER — MISOPROSTOL 200 MCG PO TABS: 400.0000 ug | ORAL_TABLET | ORAL | Status: DC

## 2023-04-09 MED ORDER — ONDANSETRON HCL 4 MG PO TABS: 4.0000 mg | ORAL_TABLET | ORAL | Status: DC | PRN

## 2023-04-09 MED ORDER — LACTATED RINGERS IV SOLN: INTRAVENOUS | Status: DC

## 2023-04-09 MED ORDER — DIPHENHYDRAMINE HCL 25 MG PO CAPS: 25.0000 mg | ORAL_CAPSULE | Freq: Four times a day (QID) | ORAL | Status: DC | PRN

## 2023-04-09 MED ORDER — LABETALOL HCL 200 MG PO TABS
400.0000 mg | ORAL_TABLET | Freq: Two times a day (BID) | ORAL | Status: DC
  Administered 2023-04-09 – 2023-04-10 (×2): 400 mg via ORAL
  Filled 2023-04-09 (×3): qty 2

## 2023-04-09 MED ORDER — DIBUCAINE (PERIANAL) 1 % EX OINT: 1.0000 | TOPICAL_OINTMENT | CUTANEOUS | Status: DC | PRN

## 2023-04-09 MED ORDER — ZOLPIDEM TARTRATE 5 MG PO TABS: 5.0000 mg | ORAL_TABLET | Freq: Every evening | ORAL | Status: DC | PRN

## 2023-04-09 MED ORDER — MEASLES, MUMPS & RUBELLA VAC IJ SOLR: 0.5000 mL | Freq: Once | INTRAMUSCULAR | Status: DC

## 2023-04-09 MED ORDER — SODIUM CHLORIDE 0.9% FLUSH: 3.0000 mL | INTRAVENOUS | Status: DC | PRN

## 2023-04-09 MED ORDER — BENZOCAINE-MENTHOL 20-0.5 % EX AERO: 1.0000 | INHALATION_SPRAY | CUTANEOUS | Status: DC | PRN

## 2023-04-09 MED ORDER — PRENATAL MULTIVITAMIN CH
1.0000 | ORAL_TABLET | Freq: Every day | ORAL | Status: DC
  Filled 2023-04-09: qty 1

## 2023-04-09 MED ORDER — LACTATED RINGERS IV SOLN: 500.0000 mL | INTRAVENOUS | Status: DC | PRN

## 2023-04-09 MED ORDER — SENNOSIDES-DOCUSATE SODIUM 8.6-50 MG PO TABS
2.0000 | ORAL_TABLET | ORAL | Status: DC
  Administered 2023-04-09: 2 via ORAL
  Filled 2023-04-09: qty 2

## 2023-04-09 MED ORDER — ACETAMINOPHEN 325 MG PO TABS: 650.0000 mg | ORAL_TABLET | ORAL | Status: DC | PRN

## 2023-04-09 MED ORDER — COCONUT OIL OIL: 1.0000 | TOPICAL_OIL | Status: DC | PRN

## 2023-04-09 MED ORDER — SODIUM CHLORIDE 0.9% FLUSH: 3.0000 mL | Freq: Two times a day (BID) | INTRAVENOUS | Status: DC

## 2023-04-09 MED ORDER — SIMETHICONE 80 MG PO CHEW: 80.0000 mg | CHEWABLE_TABLET | ORAL | Status: DC | PRN

## 2023-04-09 MED ORDER — SODIUM CHLORIDE 0.9% FLUSH: 10.0000 mL | Freq: Two times a day (BID) | INTRAVENOUS | Status: DC

## 2023-04-09 NOTE — Progress Notes (Signed)
Post Partum Day 0 Subjective: Both legs feel tired below her knees  Objective: Blood pressure 111/60, pulse 73, temperature 98.1 F (36.7 C), temperature source Oral, resp. rate (!) 22, height 4\' 9"  (1.448 m), weight 73.9 kg, SpO2 95%, unknown if currently breastfeeding.  Physical Exam:  General: alert, cooperative, and no distress Lochia: appropriate Uterine Fundus: firm Incision: n/a DVT Evaluation: No evidence of DVT seen on physical exam. Negative Homan's sign. No cords or calf tenderness. No significant calf/ankle edema.  Recent Labs    04/09/23 0328 04/09/23 0518  HGB 12.4 12.0  HCT 36.8 35.7*    Assessment/Plan: Plan for discharge tomorrow Magnesium sulfate IV for 24 hr PP  LOS: 3 days   Scheryl Darter, MD 04/09/2023, 10:40 AM

## 2023-04-09 NOTE — Discharge Summary (Signed)
Postpartum Discharge Summary  Date of Service updated 04/10/2023     Patient Name: Kathryn Glenn DOB: 1980/06/26 MRN: [id number]  Date of admission: 04/06/2023 Delivery date:04/09/2023 Delivering provider: Wyn Forster Date of discharge: 04/10/2023  Admitting diagnosis: Fetal growth restriction antepartum [O36.5990] Gestational diabetes mellitus (GDM) [O24.419] Intrauterine pregnancy: [redacted]w[redacted]d     Secondary diagnosis:  Principal Problem:   Fetal growth restriction antepartum Active Problems:   [redacted] weeks gestation of pregnancy   Gestational diabetes mellitus (GDM)   Fetal demise > 22 weeks, delivered, current hospitalization  Additional problems: preeclampsia with severe features    Discharge diagnosis: Preterm Pregnancy Delivered, VBAC, Preeclampsia (severe), GDM A1, and IUFD @ 24 weeks                                               Post partum procedures: no Augmentation: Cytotec Complications: [facility] course: Induction of Labor With Vaginal Delivery   42 y.o. yo [id number] at [redacted]w[redacted]d was admitted to the hospital 04/06/2023 for induction of labor.  Indication for induction:  IUFD .  Patient had an labor course uncomplicated. Membrane Rupture Time/Date:  ,  Delivered encall Delivery Method:VBAC, Spontaneous Operative Delivery:N/A Episiotomy: NoneNone Lacerations:  NoneNone Details of delivery can be found in separate delivery note.  Patient had a postpartum course complicated by management of preeclampsia with severe features. Patient is discharged home 04/10/2023  Newborn Data: Birth date:04/09/2023 Birth time:12:28 AM Gender:FemaleFemale Living status:Fetal Demise Apgars: , N/A Weight:301 g  Magnesium Sulfate received: Yes: Seizure prophylaxis BMZ received: No Rhophylac:N/A MMR:Yes T-DaP:Given postpartum Flu: No RSV Vaccine received: No Transfusion:No  Immunizations received: Immunization History  Administered Date(s) Administered    Influenza Split 04/22/2012, 04/20/2015   PFIZER(Purple Top)SARS-COV-2 Vaccination 02/03/2020, 03/01/2020   Tdap 07/29/2012    Physical exam  Vitals:   04/09/23 2155 04/10/23 0100 04/10/23 0503 04/10/23 0931  BP: (!) 143/74 137/73 (!) 142/66 (!) 142/75  Pulse: 66 76 76 77  Resp: 18 18 18 17   Temp: 98.5 F (36.9 C)  98.4 F (36.9 C) 98.5 F (36.9 C)  TempSrc: Oral  Oral Oral  SpO2: 98%  98% 99%  Weight:      Height:       General: alert, cooperative, and no distress Lochia: appropriate Uterine Fundus: firm Incision: N/A DVT Evaluation: No evidence of DVT seen on physical exam. Labs: Lab Results  Component Value Date   WBC 10.3 04/09/2023   HGB 12.0 04/09/2023   HCT 35.7 (L) 04/09/2023   MCV 88.8 04/09/2023   PLT 163 04/09/2023      Latest Ref Rng & Units 04/07/2023    4:10 AM  CMP  Glucose 70 - 99 mg/dL 147   BUN 6 - 20 mg/dL 11   Creatinine 8.29 - 1.00 mg/dL 5.62   Sodium 130 - 865 mmol/L 135   Potassium 3.5 - 5.1 mmol/L 3.6   Chloride 98 - 111 mmol/L 107   CO2 22 - 32 mmol/L 19   Calcium 8.9 - 10.3 mg/dL 7.0   Total Protein 6.5 - 8.1 g/dL 5.7   Total Bilirubin 0.3 - 1.2 mg/dL 0.3   Alkaline Phos 38 - 126 U/L 63   AST 15 - 41 U/L 31   ALT 0 - 44 U/L 31    Edinburgh Score:     No data to display  No data recorded  After visit meds:  Allergies as of 04/10/2023   No Known Allergies      Medication List     TAKE these medications    Accu-Chek Guide w/Device Kit 1 Device by Does not apply route in the morning, at noon, in the evening, and at bedtime.   aspirin EC 81 MG tablet Take 2 tablets (162 mg total) by mouth daily. Swallow whole.   ibuprofen 600 MG tablet Commonly known as: ADVIL Take 1 tablet (600 mg total) by mouth every 6 (six) hours.   labetalol 200 MG tablet Commonly known as: NORMODYNE Take 2 tablets (400 mg total) by mouth 2 (two) times daily.   multivitamin-prenatal 27-0.8 MG Tabs tablet Take 1 tablet by mouth daily  at 12 noon.         Discharge home in stable condition Infant Feeding:  N/A Infant Disposition:morgue Discharge instruction: per After Visit Summary and Postpartum booklet. Activity: Advance as tolerated. Pelvic rest for 6 weeks.  Diet: routine diet Future Appointments: Future Appointments  Date Time Provider Department Center  04/28/2023 10:55 AM Venora Maples, MD [facility]   Follow up Visit:  Follow-up Information     [facility] Follow up in 6 week(s).   Specialty: Obstetrics and Gynecology Contact information: [address] [place] [address] [phone]              Message sent to [facility] 10/17   Please schedule this patient for a In person postpartum visit in 6 weeks with the following provider: MD. Additional Postpartum F/U:Postpartum Depression checkup, 2 hour GTT, and BP check 1 week  High risk pregnancy complicated by: GDM, HTN, and IUFD Delivery mode:  VBAC, Spontaneous Anticipated Birth Control:  Nexplanon out patient   04/10/2023 Scheryl Darter, MD

## 2023-04-09 NOTE — Anesthesia Postprocedure Evaluation (Signed)
Anesthesia Post Note  Patient: Kathryn Glenn  Procedure(s) Performed: AN AD HOC LABOR EPIDURAL     Patient location during evaluation: Mother Baby Anesthesia Type: Epidural Level of consciousness: awake and alert and oriented Pain management: satisfactory to patient Vital Signs Assessment: post-procedure vital signs reviewed and stable Respiratory status: respiratory function stable Cardiovascular status: stable Postop Assessment: no headache, no backache, epidural receding, patient able to bend at knees, no signs of nausea or vomiting, adequate PO intake and able to ambulate Anesthetic complications: no   No notable events documented.  Last Vitals:  Vitals:   04/09/23 0900 04/09/23 1012  BP:  111/60  Pulse:  73  Resp: 18 (!) 22  Temp:  36.7 C  SpO2:      Last Pain:  Vitals:   04/09/23 1012  TempSrc: Oral  PainSc:    Pain Goal: Patients Stated Pain Goal: 0 (04/08/23 2230)                 Hiroto Saltzman

## 2023-04-10 MED ORDER — LABETALOL HCL 200 MG PO TABS: 400.0000 mg | ORAL_TABLET | Freq: Two times a day (BID) | ORAL | 1 refills | Status: AC

## 2023-04-10 MED ORDER — IBUPROFEN 600 MG PO TABS: 600.0000 mg | ORAL_TABLET | Freq: Four times a day (QID) | ORAL | 0 refills | Status: AC

## 2023-04-10 NOTE — Plan of Care (Signed)
Pt to be discharged home with printed instructions. Leandra Vanderweele L Lola Lofaro, RN  

## 2023-04-11 NOTE — Plan of Care (Signed)
[facility] Tonsillectomy/Adenoidectomy, Postoperative PEDS care plan entered in error.

## 2023-04-20 ENCOUNTER — Ambulatory Visit: Payer: Self-pay

## 2023-04-28 ENCOUNTER — Encounter: Payer: Self-pay | Admitting: Family Medicine

## 2023-04-28 ENCOUNTER — Ambulatory Visit (INDEPENDENT_AMBULATORY_CARE_PROVIDER_SITE_OTHER): Payer: Self-pay | Admitting: Family Medicine

## 2023-04-28 ENCOUNTER — Other Ambulatory Visit: Payer: Self-pay

## 2023-04-28 DIAGNOSIS — Z758 Other problems related to medical facilities and other health care: Secondary | ICD-10-CM

## 2023-04-28 DIAGNOSIS — Z8632 Personal history of gestational diabetes: Secondary | ICD-10-CM

## 2023-04-28 DIAGNOSIS — Z603 Acculturation difficulty: Secondary | ICD-10-CM

## 2023-04-28 DIAGNOSIS — I10 Essential (primary) hypertension: Secondary | ICD-10-CM

## 2023-04-28 DIAGNOSIS — Z8759 Personal history of other complications of pregnancy, childbirth and the puerperium: Secondary | ICD-10-CM

## 2023-04-28 MED ORDER — NIFEDIPINE ER OSMOTIC RELEASE 30 MG PO TB24
30.0000 mg | ORAL_TABLET | Freq: Every day | ORAL | 3 refills | Status: AC
Start: 2023-04-28 — End: ?

## 2023-04-28 NOTE — Patient Instructions (Signed)
Eleccin del mtodo anticonceptivo Contraception Choices Los mtodos anticonceptivos son las cosas que usted hace o [place] para Location manager. Tambin se los denomina "anticoncepcin". Hay varios mtodos anticonceptivos. Hable con su mdico sobre el mejor mtodo para usted. Mtodos anticonceptivos hormonales Este tipo de mtodo anticonceptivo contiene hormonas. A continuacin se mencionan algunos tipos de mtodos anticonceptivos hormonales: Un tubo que se coloca debajo de la piel del brazo (implante). El tubo Insurance claims handler colocado durante 3 aos como mximo. Inyecciones que se deben aplicar cada 3 meses. Pldoras que se deben tomar [employer]. Un parche que se debe cambiar 1 vez por semana durante 3 semanas. Despus de [employer], el parche se debe retirar durante 1 semana. Un anillo que se coloca en la vagina. El anillo se deja colocado durante 3 semanas. Luego, se debe retirar de la vagina durante 1 semana. Despus se coloca un nuevo anillo en la vagina. Pldoras que se deben tomar despus de tener relaciones sexuales sin proteccin. Estas se denominan pldoras anticonceptivas de emergencia. Mtodos anticonceptivos de barrera A continuacin se mencionan algunos tipos de mtodos anticonceptivos de barrera: Una cubierta delgada que se coloca sobre el pene antes de tener sexo (condn masculino). La cubierta se desecha despus de [employer]. Una cubierta blanda y suelta que se coloca en la vagina antes de tener sexo (condn femenino). La cubierta se desecha despus de [employer]. Un dispositivo de goma que se aplica sobre el cuello uterino (diafragma). Este dispositivo debe fabricarse para usted. Se coloca en la vagina antes de tener sexo. Se debe dejar colocado durante 6 a 8 horas despus de [employer]. Se debe retirar en un plazo de 24 horas. Un capuchn pequeo y [employer] se fija sobre el cuello uterino (capuchn cervical). Este capuchn debe fabricarse para usted. El capuchn se debe  dejar colocado durante 6 a 8 horas despus de Management consultant. Se debe retirar en un plazo de 48 horas. Una esponja que se coloca en la vagina antes de tener sexo. Se debe dejar colocada durante al menos 6 horas despus de [employer]. Se debe retirar en un plazo de 30 horas y desecharse. Una sustancia qumica que destruye o impide que los espermatozoides ingresen al tero. Esa sustancia qumica se llama espermicida. Se puede presentar en forma de pldora, crema, gel o espuma y se debe colocar en la vagina. La sustancia qumica se debe usar al [employer] de 10 a 15 minutos antes de [employer]. Mtodos anticonceptivos con un DIU DIU significa "dispositivo intrauterino". Se coloca en el interior del tero. Existen dos tipos diferentes: DIU hormonal. Este tipo puede permanecer colocado en el tero durante 3 a 5 aos. DIU de cobre. Este tipo Insurance claims handler colocado en el tero durante 10 aos. Mtodo anticonceptivo permanente A continuacin se mencionan algunos tipos de mtodos anticonceptivos permanentes: [place] para obstruir las trompas de [place]. Colocacin de un dispositivo en cada una de las trompas de [place]. Este mtodo funciona al cabo de 3 meses. Se deben usar otros mtodos anticonceptivos durante 3 meses. [place] para atar los conductos que transportan los espermatozoides (vasectoma). Este mtodo funciona al cabo de 3 meses. Se deben usar otros mtodos anticonceptivos durante 3 meses. Mtodos anticonceptivos por planificacin natural A continuacin se mencionan algunos mtodos anticonceptivos por planificacin natural: No tener [employer] frtiles de la [place]. Usar un calendario a fin de: Insurance risk surveyor de la duracin de cada ciclo menstrual. Determinar en [employer] se podra producir Firefighter.  Planificar no tener [employer] en que se podra producir Firefighter. Reconocer los signos de la ovulacin y no tener relaciones sexuales durante ese perodo. Craig Stag[employer]  en que la mujer puede detectar la ovulacin es controlarse la temperatura. Esperar para tener sexo hasta despus de la ovulacin. Dnde buscar ms informacin Centers for Disease Control and Prevention (Centros para el Control y Psychiatrist de Event organiser): [email or web address] Resumen La anticoncepcin, o los mtodos anticonceptivos, hace referencia a las cosas que usted hace o [place] para Location manager. Los mtodos anticonceptivos hormonales incluyen implantes, inyecciones, pldoras, parches, anillos vaginales y pldoras anticonceptivas de Associate Professor. Los mtodos anticonceptivos de barrera pueden incluir preservativos masculinos, preservativos femeninos, diafragmas, capuchones cervicales, esponjas y espermicidas. [employer] tipos diferentes de DIU (dispositivo intrauterino) anticonceptivo. Un DIU puede colocarse en el tero de una mujer para evitar el embarazo durante varios aos. Un mtodo anticonceptivo [employer] un procedimiento para hombres, mujeres o ambos. La planificacin familiar natural significa no tener sexo cuando la mujer podra quedar embarazada. Esta informacin no tiene Theme park manager el consejo del mdico. Asegrese de hacerle al mdico cualquier pregunta que tenga. Document Revised: 01/10/2020 Document Reviewed: 01/10/2020 Elsevier Patient Education  2024 [employer].

## 2023-04-28 NOTE — Progress Notes (Signed)
Post Partum Visit Note  Kathryn Glenn is a 42 y.o. [phone] female who presents for a postpartum visit. She is 2 weeks 5 days postpartum following a normal spontaneous vaginal delivery following IUFD at [redacted]w[redacted]d.  I have fully reviewed the prenatal and intrapartum course. Anesthesia: epidural. Postpartum course has been uneventful. Bleeding staining only. Bowel function is normal. Bladder function is normal. Patient is not sexually active. Contraception method is Nexplanon. Postpartum depression screening: deferred, reports good support at home from her daughters, denies SI. Offered [facility] services, pt declines.   The pregnancy intention screening data noted above was reviewed. Potential methods of contraception were discussed. The patient elected to proceed with No data recorded.    Health Maintenance Due  Topic Date Due   Cervical Cancer Screening (HPV/Pap Cotest)  03/31/2015   DTaP/Tdap/Td (2 - Td or Tdap) 07/29/2022   COVID-19 Vaccine (3 - 2023-24 season) 02/22/2023    The following portions of the patient's history were reviewed and updated as appropriate: allergies, current medications, past family history, past medical history, past social history, past surgical history, and problem list.  Review of Systems Pertinent items noted in HPI and remainder of comprehensive ROS otherwise negative.  Objective:  BP (!) 155/89   Pulse 66   Breastfeeding No    General:  alert, cooperative, and appears stated age   Breasts:  not indicated  Lungs: Comfortalbe on room air  Wound N/a  GU exam:  not indicated        Assessment:    1. Postpartum exam See below  2. Language barrier Spanish  3. History of gestational diabetes Plan for A1c check once connected with PCP, referral given  4. History of IUFD IUFD at 24 wks in setting of severe IUGR  5. Essential hypertension Taking labetalol 200 BID, not controlled Add nifedipine 30 XL Referred to PCP   Normal postpartum  exam.   Plan:   Essential components of care per ACOG recommendations:  1.  Mood and well being: Patient with positive depression screening today. Reviewed local resources for support. She declined [facility] referral. Contracted for safety, reviewed resources here and at [facility]. - Patient tobacco use? No.   - hx of drug use? No.    2. Infant care and feeding: n/a  3. Sexuality, contraception and birth spacing - Patient does not want a pregnancy in the next year.  Desired family size is 4 children.  - Reviewed reproductive life planning. Reviewed contraceptive methods based on pt preferences and effectiveness.  Patient desired Hormonal Implant today.  We will coordinate with [facility] to have this placed;Marland Kitchen - Discussed birth spacing of 18 months  4. Sleep and fatigue -Encouraged family/partner/community support of 4 hrs of uninterrupted sleep to help with mood and fatigue  5. Physical Recovery  - Discussed patients delivery and complications. She describes her labor as uneventful - Patient had a Vaginal, no problems at delivery. Patient had a no laceration.  - Patient has urinary incontinence? No. - Patient is safe to resume physical and sexual activity  6.  Health Maintenance - HM due items addressed Yes - Last pap smear No results found for: "DIAGPAP" Pap smear not done at today's visit. Message sent to [facility].  -Breast Cancer screening indicated? Yes. Patient referred today for mammogram. Message sent to [facility].  7. Chronic Disease/Pregnancy Condition follow up:  see above  - PCP follow up   Venora Maples, MD, MPH, FAAFP Attending Family Medicine Physician, [facility]  [facility], [facility]

## 2023-05-18 ENCOUNTER — Telehealth: Payer: Self-pay

## 2023-05-18 NOTE — Telephone Encounter (Signed)
[facility] Department to follow up on Nexplanon insertion appt. Able to speak with Lorne Skeens who did not receive my voicemail with patient details. She will reach out to patient to schedule for soonest available appt.  Called pt with CAP interpreter Gershon Cull to provide update; VM left stating I will attempt to call her another day.

## 2023-05-19 NOTE — Telephone Encounter (Signed)
Called pt with interpreter Zoila; explained [facility] will be in contact to schedule appt. Also reviewed upcoming new pt PCP appt at patient's request.

## 2023-05-27 ENCOUNTER — Ambulatory Visit: Payer: Self-pay | Admitting: Nurse Practitioner
